# Patient Record
Sex: Male | Born: 1965 | Race: White | Hispanic: No | Marital: Single | Smoking: Never smoker
Health system: Southern US, Community
[De-identification: ages and names within clinical notes are randomized; demographics above are authoritative.]

## PROBLEM LIST (undated history)

## (undated) DIAGNOSIS — E119 Type 2 diabetes mellitus without complications: Secondary | ICD-10-CM

## (undated) DIAGNOSIS — E785 Hyperlipidemia, unspecified: Secondary | ICD-10-CM

## (undated) DIAGNOSIS — I10 Essential (primary) hypertension: Secondary | ICD-10-CM

## (undated) HISTORY — PX: BILATERAL CARPAL TUNNEL RELEASE: SHX6508

## (undated) HISTORY — DX: Essential (primary) hypertension: I10

## (undated) HISTORY — DX: Type 2 diabetes mellitus without complications: E11.9

## (undated) HISTORY — PX: MENISCUS REPAIR: SHX5179

---

## 1998-05-15 ENCOUNTER — Emergency Department (HOSPITAL_COMMUNITY): Admission: EM | Admit: 1998-05-15 | Discharge: 1998-05-15 | Payer: Self-pay | Admitting: Emergency Medicine

## 1998-07-12 ENCOUNTER — Emergency Department (HOSPITAL_COMMUNITY): Admission: EM | Admit: 1998-07-12 | Discharge: 1998-07-12 | Payer: Self-pay | Admitting: Emergency Medicine

## 1998-07-12 ENCOUNTER — Encounter: Payer: Self-pay | Admitting: Emergency Medicine

## 1998-09-29 DIAGNOSIS — I10 Essential (primary) hypertension: Secondary | ICD-10-CM

## 1998-09-29 DIAGNOSIS — E119 Type 2 diabetes mellitus without complications: Secondary | ICD-10-CM

## 1998-09-29 HISTORY — DX: Essential (primary) hypertension: I10

## 1998-09-29 HISTORY — DX: Type 2 diabetes mellitus without complications: E11.9

## 1998-11-08 ENCOUNTER — Emergency Department (HOSPITAL_COMMUNITY): Admission: EM | Admit: 1998-11-08 | Discharge: 1998-11-08 | Payer: Self-pay | Admitting: Emergency Medicine

## 1998-12-10 ENCOUNTER — Emergency Department (HOSPITAL_COMMUNITY): Admission: EM | Admit: 1998-12-10 | Discharge: 1998-12-10 | Payer: Self-pay | Admitting: Internal Medicine

## 1999-04-16 ENCOUNTER — Inpatient Hospital Stay (HOSPITAL_COMMUNITY): Admission: EM | Admit: 1999-04-16 | Discharge: 1999-04-17 | Payer: Self-pay | Admitting: Emergency Medicine

## 1999-04-16 ENCOUNTER — Encounter: Payer: Self-pay | Admitting: Internal Medicine

## 1999-08-01 ENCOUNTER — Encounter: Payer: Self-pay | Admitting: Emergency Medicine

## 1999-08-01 ENCOUNTER — Emergency Department (HOSPITAL_COMMUNITY): Admission: EM | Admit: 1999-08-01 | Discharge: 1999-08-01 | Payer: Self-pay | Admitting: Emergency Medicine

## 1999-08-03 ENCOUNTER — Ambulatory Visit (HOSPITAL_COMMUNITY): Admission: RE | Admit: 1999-08-03 | Discharge: 1999-08-03 | Payer: Self-pay | Admitting: Orthopedic Surgery

## 1999-08-03 ENCOUNTER — Encounter: Payer: Self-pay | Admitting: Orthopedic Surgery

## 1999-08-21 ENCOUNTER — Encounter: Admission: RE | Admit: 1999-08-21 | Discharge: 1999-09-19 | Payer: Self-pay | Admitting: Orthopedic Surgery

## 1999-10-27 ENCOUNTER — Emergency Department (HOSPITAL_COMMUNITY): Admission: EM | Admit: 1999-10-27 | Discharge: 1999-10-27 | Payer: Self-pay | Admitting: Emergency Medicine

## 1999-10-29 ENCOUNTER — Emergency Department (HOSPITAL_COMMUNITY): Admission: EM | Admit: 1999-10-29 | Discharge: 1999-10-29 | Payer: Self-pay

## 2000-02-20 ENCOUNTER — Emergency Department (HOSPITAL_COMMUNITY): Admission: EM | Admit: 2000-02-20 | Discharge: 2000-02-20 | Payer: Self-pay | Admitting: Emergency Medicine

## 2000-09-17 ENCOUNTER — Emergency Department (HOSPITAL_COMMUNITY): Admission: EM | Admit: 2000-09-17 | Discharge: 2000-09-17 | Payer: Self-pay | Admitting: Emergency Medicine

## 2001-08-02 ENCOUNTER — Emergency Department (HOSPITAL_COMMUNITY): Admission: EM | Admit: 2001-08-02 | Discharge: 2001-08-02 | Payer: Self-pay | Admitting: Emergency Medicine

## 2001-08-02 ENCOUNTER — Encounter: Payer: Self-pay | Admitting: Emergency Medicine

## 2001-08-04 ENCOUNTER — Emergency Department (HOSPITAL_COMMUNITY): Admission: EM | Admit: 2001-08-04 | Discharge: 2001-08-04 | Payer: Self-pay | Admitting: Unknown Physician Specialty

## 2001-08-04 ENCOUNTER — Encounter: Payer: Self-pay | Admitting: Emergency Medicine

## 2001-09-08 ENCOUNTER — Emergency Department (HOSPITAL_COMMUNITY): Admission: EM | Admit: 2001-09-08 | Discharge: 2001-09-08 | Payer: Self-pay | Admitting: Emergency Medicine

## 2002-09-29 HISTORY — PX: ACROMIO-CLAVICULAR JOINT REPAIR: SHX5183

## 2005-04-13 ENCOUNTER — Emergency Department (HOSPITAL_COMMUNITY): Admission: EM | Admit: 2005-04-13 | Discharge: 2005-04-13 | Payer: Self-pay | Admitting: *Deleted

## 2007-01-08 ENCOUNTER — Emergency Department (HOSPITAL_COMMUNITY): Admission: EM | Admit: 2007-01-08 | Discharge: 2007-01-08 | Payer: Self-pay | Admitting: Emergency Medicine

## 2007-02-19 ENCOUNTER — Encounter: Admission: RE | Admit: 2007-02-19 | Discharge: 2007-02-19 | Payer: Self-pay | Admitting: Orthopedic Surgery

## 2009-02-04 ENCOUNTER — Emergency Department (HOSPITAL_COMMUNITY): Admission: EM | Admit: 2009-02-04 | Discharge: 2009-02-04 | Payer: Self-pay | Admitting: Emergency Medicine

## 2009-09-27 ENCOUNTER — Inpatient Hospital Stay (HOSPITAL_COMMUNITY): Admission: EM | Admit: 2009-09-27 | Discharge: 2009-09-30 | Payer: Self-pay | Admitting: Emergency Medicine

## 2010-09-25 ENCOUNTER — Emergency Department (HOSPITAL_COMMUNITY)
Admission: EM | Admit: 2010-09-25 | Discharge: 2010-09-25 | Payer: Self-pay | Source: Home / Self Care | Admitting: Emergency Medicine

## 2010-10-02 ENCOUNTER — Encounter
Admission: RE | Admit: 2010-10-02 | Discharge: 2010-10-29 | Payer: Self-pay | Source: Home / Self Care | Attending: Orthopedic Surgery | Admitting: Orthopedic Surgery

## 2010-12-15 LAB — COMPREHENSIVE METABOLIC PANEL
ALT: 46 U/L (ref 0–53)
CO2: 32 mEq/L (ref 19–32)
Calcium: 8.7 mg/dL (ref 8.4–10.5)
Chloride: 103 mEq/L (ref 96–112)
Creatinine, Ser: 1.04 mg/dL (ref 0.4–1.5)
GFR calc non Af Amer: >60 mL/min (ref >60)
Glucose, Bld: 135 mg/dL — ABNORMAL HIGH (ref 70–99)
Total Bilirubin: 0.4 mg/dL (ref 0.3–1.2)

## 2010-12-15 LAB — CBC
HCT: 41.1 % (ref 39.0–52.0)
MCV: 97.9 fL (ref 78.0–100.0)
RBC: 4.19 MIL/uL — ABNORMAL LOW (ref 4.22–5.81)
WBC: 9 10*3/uL (ref 4.0–10.5)

## 2010-12-15 LAB — DIFFERENTIAL
Basophils Absolute: 0.1 10*3/uL (ref 0.0–0.1)
Basophils Relative: 1 % (ref 0–1)
Eosinophils Relative: 4 % (ref 0–5)
Lymphocytes Relative: 22 % (ref 12–46)

## 2010-12-15 LAB — GLUCOSE, CAPILLARY
Glucose-Capillary: 115 mg/dL — ABNORMAL HIGH (ref 70–99)
Glucose-Capillary: 125 mg/dL — ABNORMAL HIGH (ref 70–99)
Glucose-Capillary: 143 mg/dL — ABNORMAL HIGH (ref 70–99)
Glucose-Capillary: 99 mg/dL (ref 70–99)

## 2010-12-15 LAB — MAGNESIUM: Magnesium: 2.1 mg/dL (ref 1.5–2.5)

## 2010-12-30 LAB — GLUCOSE, CAPILLARY
Glucose-Capillary: 111 mg/dL — ABNORMAL HIGH (ref 70–99)
Glucose-Capillary: 122 mg/dL — ABNORMAL HIGH (ref 70–99)
Glucose-Capillary: 124 mg/dL — ABNORMAL HIGH (ref 70–99)
Glucose-Capillary: 136 mg/dL — ABNORMAL HIGH (ref 70–99)

## 2010-12-30 LAB — BASIC METABOLIC PANEL
BUN: 9 mg/dL (ref 6–23)
BUN: 9 mg/dL (ref 6–23)
CO2: 27 mEq/L (ref 19–32)
Chloride: 102 mEq/L (ref 96–112)
Chloride: 105 mEq/L (ref 96–112)
Creatinine, Ser: 1.02 mg/dL (ref 0.4–1.5)
GFR calc non Af Amer: >60 mL/min (ref >60)
Glucose, Bld: 245 mg/dL — ABNORMAL HIGH (ref 70–99)
Potassium: 3.6 mEq/L (ref 3.5–5.1)
Potassium: 4 mEq/L (ref 3.5–5.1)
Sodium: 136 mEq/L (ref 135–145)

## 2010-12-30 LAB — CBC
HCT: 41.8 % (ref 39.0–52.0)
HCT: 47 % (ref 39.0–52.0)
Hemoglobin: 14.6 g/dL (ref 13.0–17.0)
Hemoglobin: 16 g/dL (ref 13.0–17.0)
MCHC: 34.2 g/dL (ref 30.0–36.0)
MCV: 98.3 fL (ref 78.0–100.0)
Platelets: 199 10*3/uL (ref 150–400)
RBC: 4.78 MIL/uL (ref 4.22–5.81)
RDW: 12.8 % (ref 11.5–15.5)
RDW: 12.9 % (ref 11.5–15.5)
WBC: 10.8 10*3/uL — ABNORMAL HIGH (ref 4.0–10.5)

## 2010-12-30 LAB — DIFFERENTIAL
Basophils Absolute: 0 10*3/uL (ref 0.0–0.1)
Basophils Relative: 0 % (ref 0–1)
Eosinophils Relative: 1 % (ref 0–5)
Monocytes Absolute: 0.8 10*3/uL (ref 0.1–1.0)
Monocytes Relative: 7 % (ref 3–12)

## 2010-12-30 LAB — HEMOGLOBIN A1C: Mean Plasma Glucose: 134 mg/dL

## 2010-12-30 LAB — LIPID PANEL: Triglycerides: 54 mg/dL (ref <150)

## 2010-12-30 LAB — CULTURE, ROUTINE-SINUS

## 2011-01-07 LAB — URINE MICROSCOPIC-ADD ON

## 2011-01-07 LAB — COMPREHENSIVE METABOLIC PANEL
AST: 38 U/L — ABNORMAL HIGH (ref 0–37)
Albumin: 3.6 g/dL (ref 3.5–5.2)
BUN: 11 mg/dL (ref 6–23)
Calcium: 8.6 mg/dL (ref 8.4–10.5)
Creatinine, Ser: 0.95 mg/dL (ref 0.4–1.5)
GFR calc Af Amer: >60 mL/min (ref >60)
GFR calc non Af Amer: >60 mL/min (ref >60)

## 2011-01-07 LAB — URINALYSIS, ROUTINE W REFLEX MICROSCOPIC
Glucose, UA: NEGATIVE mg/dL
Hgb urine dipstick: NEGATIVE
Specific Gravity, Urine: 1.03 (ref 1.005–1.030)
pH: 6 (ref 5.0–8.0)

## 2011-01-07 LAB — CBC
HCT: 45.8 % (ref 39.0–52.0)
MCHC: 34.9 g/dL (ref 30.0–36.0)
MCV: 92.9 fL (ref 78.0–100.0)
Platelets: 252 10*3/uL (ref 150–400)

## 2011-01-07 LAB — DIFFERENTIAL
Basophils Absolute: 0 10*3/uL (ref 0.0–0.1)
Eosinophils Relative: 4 % (ref 0–5)
Lymphocytes Relative: 29 % (ref 12–46)
Lymphs Abs: 1.6 10*3/uL (ref 0.7–4.0)
Monocytes Absolute: 0.6 10*3/uL (ref 0.1–1.0)
Neutro Abs: 3.2 10*3/uL (ref 1.7–7.7)

## 2011-01-07 LAB — GLUCOSE, CAPILLARY: Glucose-Capillary: 130 mg/dL — ABNORMAL HIGH (ref 70–99)

## 2011-01-07 LAB — LIPASE, BLOOD: Lipase: 44 U/L (ref 11–59)

## 2015-05-31 ENCOUNTER — Ambulatory Visit: Payer: Self-pay | Admitting: Orthopedic Surgery

## 2015-06-19 ENCOUNTER — Ambulatory Visit: Payer: Self-pay | Admitting: Orthopedic Surgery

## 2015-06-28 ENCOUNTER — Ambulatory Visit (INDEPENDENT_AMBULATORY_CARE_PROVIDER_SITE_OTHER): Payer: Self-pay | Admitting: Orthopedic Surgery

## 2015-06-28 VITALS — BP 167/90 | Ht 67.0 in | Wt 237.4 lb

## 2015-06-28 DIAGNOSIS — M25571 Pain in right ankle and joints of right foot: Secondary | ICD-10-CM

## 2015-06-28 MED ORDER — NAPROXEN 500 MG PO TABS: 500.0000 mg | ORAL_TABLET | Freq: Two times a day (BID) | ORAL | Status: DC

## 2015-06-28 NOTE — Patient Instructions (Signed)
Wear cam walker for 6 weeks when walking Pick up your prescription at pharmacy

## 2015-06-28 NOTE — Progress Notes (Signed)
Patient ID: Cory Mcmahon, male   DOB: Jan 08, 1966, 49 y.o.   MRN: [id number]  New patient   Chief Complaint  Patient presents with  . Ankle Pain    Right ankle pain     Cory Mcmahon is a 49 y.o. male.   HPI 49 year old male presents with medial distal tibial pain and stiffness in the ankle joint with negative x-rays no history of trauma; symptoms present 3 months no trauma. Review of Systems Back pain anxiety and swollen joints and burning pain in his legs  Medical history he reports diabetes depression hypertension arthritis  He's had a left and right carpal tunnel a right anterior cruciate ligament a left meniscus repair 2 and a bone spur removed left shoulder medicines lisinopril and metformin  Social History Social History  Substance Use Topics  . Smoking status: Not on file  . Smokeless tobacco: Not on file  . Alcohol Use: Not on file    No Known Allergies  Current Outpatient Prescriptions  Medication Sig Dispense Refill  . lisinopril (PRINIVIL,ZESTRIL) 10 MG tablet Take 10 mg by mouth daily.    . metFORMIN (GLUCOPHAGE) 500 MG tablet Take by mouth 2 (two) times daily with a meal.    . naproxen (NAPROSYN) 500 MG tablet Take 1 tablet (500 mg total) by mouth 2 (two) times daily with a meal. 60 tablet 2   No current facility-administered medications for this visit.       Physical Exam Blood pressure 167/90, height  (1.702 m), weight 237 lb 6.4 oz (107.684 kg). Physical Exam The patient is well developed well nourished and well groomed. Orientation to person place and time is normal  Mood is pleasant. Ambulatory status is normal without a limp Skin remains intact without laceration ulceration or erythema Gross motor exam is intact without atrophy. Muscle tone normal grade 5 motor strength Neurovascular exam remains intact Inspection left ankle looks normal with full range of motion  Right ankle is tender over the posterior medial tibia near the medial  malleolus but not in the plantar aspect of the foot there is no evidence of pes planus joint range of motion passively is normal and there is no instability    Data Reviewed  I interpreted the images as  Normal  Assessment   Posterior tibial tendinitis perhaps Plan   Recommend Naprosyn and short Cam Walker for 6 weeks and repeat exam if no improvement MRI of the ankle joint

## 2015-07-25 ENCOUNTER — Encounter: Payer: Self-pay | Admitting: Orthopedic Surgery

## 2015-08-08 ENCOUNTER — Encounter: Payer: Self-pay | Admitting: Physician Assistant

## 2015-08-08 ENCOUNTER — Ambulatory Visit: Payer: Self-pay | Admitting: Physician Assistant

## 2015-08-08 VITALS — BP 152/100 | HR 75 | Temp 98.2°F | Ht 66.5 in | Wt 239.0 lb

## 2015-08-08 DIAGNOSIS — Z131 Encounter for screening for diabetes mellitus: Secondary | ICD-10-CM

## 2015-08-08 DIAGNOSIS — Z13220 Encounter for screening for lipoid disorders: Secondary | ICD-10-CM

## 2015-08-08 DIAGNOSIS — I10 Essential (primary) hypertension: Secondary | ICD-10-CM

## 2015-08-08 DIAGNOSIS — E669 Obesity, unspecified: Secondary | ICD-10-CM

## 2015-08-08 DIAGNOSIS — E119 Type 2 diabetes mellitus without complications: Secondary | ICD-10-CM

## 2015-08-08 LAB — GLUCOSE, POCT (MANUAL RESULT ENTRY): POC GLUCOSE: 107 mg/dL — AB (ref 70–99)

## 2015-08-08 MED ORDER — LISINOPRIL 10 MG PO TABS: 10.0000 mg | ORAL_TABLET | Freq: Every day | ORAL | Status: DC

## 2015-08-08 MED ORDER — METFORMIN HCL 500 MG PO TABS: 500.0000 mg | ORAL_TABLET | Freq: Two times a day (BID) | ORAL | Status: DC

## 2015-08-08 NOTE — Progress Notes (Signed)
BP 152/100 mmHg  Pulse 75  Temp(Src) 98.2 F (36.8 C)  Ht 5' 6.5" (1.689 m)  Wt 239 lb (108.41 kg)  BMI 38.00 kg/m2  SpO2 98%   Subjective:    Patient ID: Cory Mcmahon, male    DOB: [birth year], 49 y.o.   MRN: [id number]  HPI: Cory Mcmahon is a 49 y.o. male presenting on 08/08/2015 for New Patient (Initial Visit)   HPI   Chief Complaint  Patient presents with  . New Patient (Initial Visit)     Pt says he ran out of his meds Sunday (2 days ago) Reviewed labs from 04/06/15 - Cr 1.51  Relevant past medical, surgical, family and social history reviewed and updated as indicated. Interim medical history since our last visit reviewed. Allergies and medications reviewed and updated.   Current outpatient prescriptions:  .  naproxen (NAPROSYN) 500 MG tablet, Take 1 tablet (500 mg total) by mouth 2 (two) times daily with a meal. (Patient taking differently: Take 500 mg by mouth as needed. ), Disp: 60 tablet, Rfl: 2 .  lisinopril (PRINIVIL,ZESTRIL) 10 MG tablet, Take 10 mg by mouth daily., Disp: , Rfl:  .  metFORMIN (GLUCOPHAGE) 500 MG tablet, Take by mouth 2 (two) times daily with a meal., Disp: , Rfl:    Review of Systems  Constitutional: Negative for fever, chills, diaphoresis, appetite change, fatigue and unexpected weight change.  HENT: Negative for congestion, dental problem, drooling, ear pain, facial swelling, hearing loss, mouth sores, sneezing, sore throat, trouble swallowing and voice change.   Eyes: Negative for pain, discharge, redness, itching and visual disturbance.  Respiratory: Negative for cough, choking, shortness of breath and wheezing.   Cardiovascular: Negative for chest pain, palpitations and leg swelling.  Gastrointestinal: Negative for vomiting, abdominal pain, diarrhea, constipation and blood in stool.  Endocrine: Negative for cold intolerance, heat intolerance and polydipsia.  Genitourinary: Negative for dysuria, hematuria and decreased urine volume.   Musculoskeletal: Negative for back pain, arthralgias and gait problem.  Skin: Negative for rash.  Allergic/Immunologic: Negative for environmental allergies.  Neurological: Negative for seizures, syncope, light-headedness and headaches.  Hematological: Negative for adenopathy.  Psychiatric/Behavioral: Positive for dysphoric mood. Negative for suicidal ideas and agitation. The patient is nervous/anxious.     Per HPI unless specifically indicated above     Objective:    BP 152/100 mmHg  Pulse 75  Temp(Src) 98.2 F (36.8 C)  Ht 5' 6.5" (1.689 m)  Wt 239 lb (108.41 kg)  BMI 38.00 kg/m2  SpO2 98%  Wt Readings from Last 3 Encounters:  08/08/15 239 lb (108.41 kg)  06/28/15 237 lb 6.4 oz (107.684 kg)    Physical Exam  Constitutional: He is oriented to person, place, and time. He appears well-developed and well-nourished.  HENT:  Head: Normocephalic and atraumatic.  Mouth/Throat: Oropharynx is clear and moist. No oropharyngeal exudate.  Eyes: Conjunctivae and EOM are normal. Pupils are equal, round, and reactive to light.  Neck: Neck supple. No thyromegaly present.  Cardiovascular: Normal rate and regular rhythm.   Pulmonary/Chest: Effort normal and breath sounds normal. He has no wheezes. He has no rales.  Abdominal: Soft. Bowel sounds are normal. He exhibits no mass. There is no tenderness.  Musculoskeletal: He exhibits no edema.  Lymphadenopathy:    He has no cervical adenopathy.  Neurological: He is alert and oriented to person, place, and time.  Skin: Skin is warm and dry. No rash noted.  Psychiatric: He has a normal mood and affect. His behavior  is normal. Thought content normal.  Vitals reviewed.  Dm foot exam done  Results for orders placed or performed in visit on 08/08/15  POCT Glucose (CBG)  Result Value Ref Range   POC Glucose 107 (A) 70 - 99 mg/dl      Assessment & Plan:   Encounter Diagnoses  Name Primary?  . Diabetes mellitus without complication (HCC)  Yes  . Screening for diabetes mellitus   . Screening cholesterol level   . Essential hypertension, benign   . Obesity, unspecified     -get baseline Labs -gave Rx for meds -pt to attend DM educ class -refer pt for DM eye exam -f/u 1 mo to review labs and recheck bp

## 2015-08-09 ENCOUNTER — Ambulatory Visit: Payer: Self-pay | Admitting: Orthopedic Surgery

## 2015-08-09 ENCOUNTER — Encounter: Payer: Self-pay | Admitting: *Deleted

## 2015-08-10 LAB — BASIC METABOLIC PANEL
BUN: 17 mg/dL (ref 7–25)
CHLORIDE: 104 mmol/L (ref 98–110)
CO2: 24 mmol/L (ref 20–31)
Calcium: 9.5 mg/dL (ref 8.6–10.3)
Creat: 0.92 mg/dL (ref 0.70–1.33)
GLUCOSE: 165 mg/dL — AB (ref 65–99)
POTASSIUM: 4.5 mmol/L (ref 3.5–5.3)
SODIUM: 140 mmol/L (ref 135–146)

## 2015-08-10 LAB — LIPID PANEL
Cholesterol: 153 mg/dL (ref 125–200)
HDL: 53 mg/dL (ref >=40)
LDL Cholesterol: 88 mg/dL (ref <130)
Total CHOL/HDL Ratio: 2.9 Ratio (ref <=5.0)
Triglycerides: 58 mg/dL (ref <150)
VLDL: 12 mg/dL (ref <30)

## 2015-08-10 LAB — HEMOGLOBIN A1C
Hgb A1c MFr Bld: 6.3 % — ABNORMAL HIGH (ref <5.7)
Mean Plasma Glucose: 134 mg/dL — ABNORMAL HIGH (ref <117)

## 2015-08-11 LAB — MICROALBUMIN, URINE: MICROALB UR: 10.6 mg/dL

## 2015-08-14 DIAGNOSIS — E669 Obesity, unspecified: Secondary | ICD-10-CM | POA: Insufficient documentation

## 2015-08-14 DIAGNOSIS — E119 Type 2 diabetes mellitus without complications: Secondary | ICD-10-CM | POA: Insufficient documentation

## 2015-08-14 DIAGNOSIS — I10 Essential (primary) hypertension: Secondary | ICD-10-CM | POA: Insufficient documentation

## 2015-09-11 ENCOUNTER — Encounter: Payer: Self-pay | Admitting: Physician Assistant

## 2015-09-11 ENCOUNTER — Ambulatory Visit: Payer: Self-pay | Admitting: Physician Assistant

## 2015-09-11 VITALS — BP 146/94 | HR 78 | Temp 92.7°F | Ht 66.5 in | Wt 249.0 lb

## 2015-09-11 DIAGNOSIS — E119 Type 2 diabetes mellitus without complications: Secondary | ICD-10-CM

## 2015-09-11 DIAGNOSIS — E669 Obesity, unspecified: Secondary | ICD-10-CM

## 2015-09-11 DIAGNOSIS — I10 Essential (primary) hypertension: Secondary | ICD-10-CM

## 2015-09-11 DIAGNOSIS — R079 Chest pain, unspecified: Secondary | ICD-10-CM

## 2015-09-11 NOTE — Progress Notes (Signed)
BP 146/94 mmHg  Pulse 78  Temp(Src) 92.7 F (33.7 C)  Ht 5' 6.5" (1.689 m)  Wt 249 lb (112.946 kg)  BMI 39.59 kg/m2  SpO2 95%   Subjective:    Patient ID: Cory Mcmahon, male    DOB: 08-02-1966, 49 y.o.   MRN: [id number]  HPI: Cory Mcmahon is a 49 y.o. male presenting on 09/11/2015 for Hypertension; Chest Pain; and Shortness of Breath   HPI  Chief Complaint  Patient presents with  . Hypertension    pt states he took his bp med today  . Chest Pain    pt states his chest hurts a little right now. pt states it comes and goes and has been going on for a couple of weeks  . Shortness of Breath    Pt is seen in [place] satellite office today.  Pt started with CP about 2 wk ago.  Pt states cp now. He denies cough.   Pt too diaphoretic for temperature reading.  No hx CAD.  Pt rates pain 3/10.    Pt describes pain as "sharp with pressure behind it:"  Pt also c/o sob  Relevant past medical, surgical, family and social history reviewed and updated as indicated. Interim medical history since our last visit reviewed. Allergies and medications reviewed and updated.  Current outpatient prescriptions:  .  lisinopril (PRINIVIL,ZESTRIL) 10 MG tablet, Take 1 tablet (10 mg total) by mouth daily., Disp: 30 tablet, Rfl: 3 .  metFORMIN (GLUCOPHAGE) 500 MG tablet, Take 1 tablet (500 mg total) by mouth 2 (two) times daily with a meal., Disp: 60 tablet, Rfl: 3  Review of Systems  Constitutional: Negative for fever, chills, diaphoresis, appetite change, fatigue and unexpected weight change.  HENT: Negative for congestion, dental problem, drooling, ear pain, facial swelling, hearing loss, mouth sores, sneezing, sore throat, trouble swallowing and voice change.   Eyes: Negative for pain, discharge, redness, itching and visual disturbance.  Respiratory: Positive for shortness of breath. Negative for cough, choking and wheezing.   Cardiovascular: Positive for chest pain. Negative for palpitations and leg  swelling.  Gastrointestinal: Negative for vomiting, abdominal pain, diarrhea, constipation and blood in stool.  Endocrine: Negative for cold intolerance, heat intolerance and polydipsia.  Genitourinary: Negative for dysuria, hematuria and decreased urine volume.  Musculoskeletal: Positive for arthralgias. Negative for back pain and gait problem.  Skin: Negative for rash.  Allergic/Immunologic: Negative for environmental allergies.  Neurological: Negative for seizures, syncope, light-headedness and headaches.  Hematological: Negative for adenopathy.  Psychiatric/Behavioral: Positive for dysphoric mood. Negative for suicidal ideas and agitation. The patient is not nervous/anxious.     Per HPI unless specifically indicated above     Objective:    BP 146/94 mmHg  Pulse 78  Temp(Src) 92.7 F (33.7 C)  Ht 5' 6.5" (1.689 m)  Wt 249 lb (112.946 kg)  BMI 39.59 kg/m2  SpO2 95%  Wt Readings from Last 3 Encounters:  09/11/15 249 lb (112.946 kg)  08/08/15 239 lb (108.41 kg)  06/28/15 237 lb 6.4 oz (107.684 kg)    Physical Exam  Constitutional: He is oriented to person, place, and time. He appears well-developed and well-nourished.  HENT:  Head: Normocephalic and atraumatic.  Neck: Neck supple.  Cardiovascular: Normal rate and regular rhythm.   Pulmonary/Chest: Effort normal and breath sounds normal. He has no wheezes.  Abdominal: Soft. Bowel sounds are normal. There is no tenderness.  Lymphadenopathy:    He has no cervical adenopathy.  Neurological: He is alert and  oriented to person, place, and time.  Skin: He is diaphoretic.  Psychiatric: He has a normal mood and affect. His behavior is normal.  Vitals reviewed.     Results for orders placed or performed in visit on 08/08/15  Lipid Profile  Result Value Ref Range   Cholesterol 153 125 - 200 mg/dL   Triglycerides 58 <161<150 mg/dL   HDL 53 >=09>=40 mg/dL   Total CHOL/HDL Ratio 2.9 <=5.0 Ratio   VLDL 12 <30 mg/dL   LDL Cholesterol  88 <604<130 mg/dL  Basic Metabolic Panel (BMET)  Result Value Ref Range   Sodium 140 135 - 146 mmol/L   Potassium 4.5 3.5 - 5.3 mmol/L   Chloride 104 98 - 110 mmol/L   CO2 24 20 - 31 mmol/L   Glucose, Bld 165 (H) 65 - 99 mg/dL   BUN 17 7 - 25 mg/dL   Creat 5.400.92 9.810.70 - 1.911.33 mg/dL   Calcium 9.5 8.6 - 47.810.3 mg/dL  Microalbumin, urine  Result Value Ref Range   Microalb, Ur 10.6 Not estab mg/dL  HgB G9FA1c  Result Value Ref Range   Hgb A1c MFr Bld 6.3 (H) <5.7 %   Mean Plasma Glucose 134 (H) <117 mg/dL  POCT Glucose (CBG)  Result Value Ref Range   POC Glucose 107 (A) 70 - 99 mg/dl      Assessment & Plan:   Encounter Diagnoses  Name Primary?  . Chest pain, unspecified chest pain type Yes  . Essential hypertension, benign   . Type 2 diabetes mellitus without complication, unspecified long term insulin use status (HCC)   . Obesity, unspecified     Discussed with pt that he needs to go to ER now for evaluation of CP.   [facility] adam[facility].  He also refuses to go to hospital in an ambulance.  I told him that I disagreed with this decision but he would not be swayed.  He says he will get someone at home (which is right across the street from our [place] office) to take him to [facility] now for evaluation of his chest pain.    No EKG done or medication administered today as we are in satellite office without the ability to do these things.  Pt to call our office tomorrow to reschedule his regular appointment

## 2015-09-18 ENCOUNTER — Emergency Department (HOSPITAL_COMMUNITY): Payer: Self-pay

## 2015-09-18 ENCOUNTER — Encounter (HOSPITAL_COMMUNITY): Payer: Self-pay

## 2015-09-18 ENCOUNTER — Emergency Department (HOSPITAL_COMMUNITY)
Admission: EM | Admit: 2015-09-18 | Discharge: 2015-09-18 | Disposition: A | Discharge status: Home / Self Care | Payer: Self-pay | Attending: Emergency Medicine | Admitting: Emergency Medicine

## 2015-09-18 DIAGNOSIS — Z7982 Long term (current) use of aspirin: Secondary | ICD-10-CM | POA: Insufficient documentation

## 2015-09-18 DIAGNOSIS — Z79899 Other long term (current) drug therapy: Secondary | ICD-10-CM | POA: Insufficient documentation

## 2015-09-18 DIAGNOSIS — R42 Dizziness and giddiness: Secondary | ICD-10-CM | POA: Insufficient documentation

## 2015-09-18 DIAGNOSIS — Z7984 Long term (current) use of oral hypoglycemic drugs: Secondary | ICD-10-CM | POA: Insufficient documentation

## 2015-09-18 DIAGNOSIS — R079 Chest pain, unspecified: Secondary | ICD-10-CM

## 2015-09-18 DIAGNOSIS — I10 Essential (primary) hypertension: Secondary | ICD-10-CM | POA: Insufficient documentation

## 2015-09-18 DIAGNOSIS — E119 Type 2 diabetes mellitus without complications: Secondary | ICD-10-CM | POA: Insufficient documentation

## 2015-09-18 HISTORY — DX: Hyperlipidemia, unspecified: E78.5

## 2015-09-18 LAB — BASIC METABOLIC PANEL
Anion gap: 9 (ref 5–15)
BUN: 23 mg/dL — ABNORMAL HIGH (ref 6–20)
CHLORIDE: 98 mmol/L — AB (ref 101–111)
CO2: 26 mmol/L (ref 22–32)
CREATININE: 1.12 mg/dL (ref 0.61–1.24)
Calcium: 8.9 mg/dL (ref 8.9–10.3)
Glucose, Bld: 175 mg/dL — ABNORMAL HIGH (ref 65–99)
POTASSIUM: 3.7 mmol/L (ref 3.5–5.1)
SODIUM: 133 mmol/L — AB (ref 135–145)

## 2015-09-18 LAB — CBC
HEMATOCRIT: 45.8 % (ref 39.0–52.0)
Hemoglobin: 16.7 g/dL (ref 13.0–17.0)
MCH: 33.1 pg (ref 26.0–34.0)
MCHC: 36.5 g/dL — ABNORMAL HIGH (ref 30.0–36.0)
MCV: 90.9 fL (ref 78.0–100.0)
PLATELETS: 276 10*3/uL (ref 150–400)
RBC: 5.04 MIL/uL (ref 4.22–5.81)
RDW: 12 % (ref 11.5–15.5)
WBC: 9.8 10*3/uL (ref 4.0–10.5)

## 2015-09-18 LAB — TROPONIN I: Troponin I: <0.03 ng/mL (ref <0.031)

## 2015-09-18 NOTE — ED Provider Notes (Signed)
CSN: [id number]     Arrival date & time 09/18/15  1415 History   First MD Initiated Contact with Patient 09/18/15 1520     Chief Complaint  Patient presents with  . Chest Pain     HPI Patient presents the emergency department complaining of fluctuating chest pain over the past week.  Today he was preparing for lunch and just got out of his car when he had mild left-sided chest discomfort without radiation.  He reported feeling slightly lightheaded and dizzy at times well.  He has no prior history of heart disease.  He does have a history of diabetes, hypertension.  No prior tobacco use.  He's never had symptoms are pain like this before.  Denies diaphoresis.  No shortness of breath.  He reports resolution of his symptoms at this time.  Symptoms are mild in severity.  Nothing worsens or improves his symptoms.  No cough.  No unilateral leg swelling.  No history DVT or PE.   Past Medical History  Diagnosis Date  . Hypertension 2000  . Diabetes mellitus without complication (HCC) 2000  . Hyperlipidemia    Past Surgical History  Procedure Laterality Date  . Bilateral carpal tunnel release Bilateral 1983 and 1984  . Acromio-clavicular joint repair Right 2004  . Meniscus repair Left 2000 and 2009   Family History  Problem Relation Age of Onset  . Diabetes Mother   . Hypertension Mother    Social History  Substance Use Topics  . Smoking status: Never Smoker   . Smokeless tobacco: Never Used  . Alcohol Use: Yes     Comment: 6 pack a week    Review of Systems  All other systems reviewed and are negative.     Allergies  Review of patient's allergies indicates no known allergies.  Home Medications   Prior to Admission medications   Medication Sig Start Date End Date Taking? Authorizing Provider  aspirin 81 MG tablet Take 81 mg by mouth daily.   Yes Historical Provider, MD  lisinopril (PRINIVIL,ZESTRIL) 10 MG tablet Take 1 tablet (10 mg total) by mouth daily. 08/08/15  Yes  Jacquelin HawkingShannon McElroy, PA-C  metFORMIN (GLUCOPHAGE) 500 MG tablet Take 1 tablet (500 mg total) by mouth 2 (two) times daily with a meal. 08/08/15  Yes Shannon McElroy, PA-C   BP 160/109 mmHg  Pulse 88  Resp 18  SpO2 98% Physical Exam  Constitutional: He is oriented to person, place, and time. He appears well-developed and well-nourished.  HENT:  Head: Normocephalic and atraumatic.  Eyes: EOM are normal.  Neck: Normal range of motion.  Cardiovascular: Normal rate, regular rhythm, normal heart sounds and intact distal pulses.   Pulmonary/Chest: Effort normal and breath sounds normal. No respiratory distress.  Abdominal: Soft. He exhibits no distension. There is no tenderness.  Musculoskeletal: Normal range of motion.  Neurological: He is alert and oriented to person, place, and time.  Skin: Skin is warm and dry.  Psychiatric: He has a normal mood and affect. Judgment normal.  Nursing note and vitals reviewed.   ED Course  Procedures (including critical care time) Labs Review Labs Reviewed  BASIC METABOLIC PANEL - Abnormal; Notable for the following:    Sodium 133 (*)    Chloride 98 (*)    Glucose, Bld 175 (*)    BUN 23 (*)    All other components within normal limits  CBC - Abnormal; Notable for the following:    MCHC 36.5 (*)    All other  components within normal limits  TROPONIN I  TROPONIN I    Imaging Review Dg Chest 2 View  09/18/2015  CLINICAL DATA:  Left anterior chest pain. Shortness of breath. Former smoker. EXAM: CHEST  2 VIEW COMPARISON:  None. FINDINGS: Normal cardiac silhouette and mediastinal contours given slightly reduced lung volumes. No focal airspace opacities. No pleural effusion or pneumothorax. No evidence of edema. Age-indeterminate though presumably chronic mild (< 25%) compression deformity involving the approximate T11 vertebral body with associated focal kyphosis at this location. Regional soft tissues appear normal. IMPRESSION: 1. No definite acute  cardiopulmonary disease on this hypoventilated examination. 2. Age-indeterminate though presumably chronic mild (under 25%) compression deformity involving the superior endplate of the approximate T11 vertebral body. Correlation point tenderness at this location is recommended. Electronically Signed   By: Simonne Come M.D.   On: 09/18/2015 14:51   I have personally reviewed and evaluated these images and lab results as part of my medical decision-making.   EKG Interpretation #1 Date/Time:  Tuesday September 18 2015 14:24:30 EST Ventricular Rate:  96 PR Interval:  158 QRS Duration: 94 QT Interval:  344 QTC Calculation: 434 R Axis:   32 Text Interpretation:  Normal sinus rhythm Normal ECG No significant change  was found Confirmed by Nakia Koble  MD, Fread Kottke ([id number]) on 09/18/2015 3:42:50 PM      EKG Interpretation #2  Date/Time:  Tuesday September 18 2015 17:35:37 EST Ventricular Rate:  88 PR Interval:  164 QRS Duration: 90 QT Interval:  353 QTC Calculation: 427 R Axis:   57 Text Interpretation:  Sinus rhythm No significant change was found Confirmed by Bradd Merlos  MD, Lainey Nelson ([id number]) on 09/18/2015 7:24:39 PM        MDM   Final diagnoses:  Chest pain, unspecified chest pain type    Troponin 2 and EKG 2 are negative.  The patient will need outpatient cardiac workup.  He's been referred to cardiology.  He understands return to ER for new or worsening symptoms.  Asymptomatic at this time.    Azalia Bilis, MD 09/19/15 2250

## 2015-09-18 NOTE — Discharge Instructions (Signed)
Nonspecific Chest Pain  °Chest pain can be caused by many different conditions. There is always a chance that your pain could be related to something serious, such as a heart attack or a blood clot in your lungs. Chest pain can also be caused by conditions that are not life-threatening. If you have chest pain, it is very important to follow up with your health care provider. °CAUSES  °Chest pain can be caused by: °· Heartburn. °· Pneumonia or bronchitis. °· Anxiety or stress. °· Inflammation around your heart (pericarditis) or lung (pleuritis or pleurisy). °· A blood clot in your lung. °· A collapsed lung (pneumothorax). It can develop suddenly on its own (spontaneous pneumothorax) or from trauma to the chest. °· Shingles infection (varicella-zoster virus). °· Heart attack. °· Damage to the bones, muscles, and cartilage that make up your chest wall. This can include: °¨ Bruised bones due to injury. °¨ Strained muscles or cartilage due to frequent or repeated coughing or overwork. °¨ Fracture to one or more ribs. °¨ Sore cartilage due to inflammation (costochondritis). °RISK FACTORS  °Risk factors for chest pain may include: °· Activities that increase your risk for trauma or injury to your chest. °· Respiratory infections or conditions that cause frequent coughing. °· Medical conditions or overeating that can cause heartburn. °· Heart disease or family history of heart disease. °· Conditions or health behaviors that increase your risk of developing a blood clot. °· Having had chicken pox (varicella zoster). °SIGNS AND SYMPTOMS °Chest pain can feel like: °· Burning or tingling on the surface of your chest or deep in your chest. °· Crushing, pressure, aching, or squeezing pain. °· Dull or sharp pain that is worse when you move, cough, or take a deep breath. °· Pain that is also felt in your back, neck, shoulder, or arm, or pain that spreads to any of these areas. °Your chest pain may come and go, or it may stay  constant. °DIAGNOSIS °Lab tests or other studies may be needed to find the cause of your pain. Your health care provider may have you take a test called an ambulatory ECG (electrocardiogram). An ECG records your heartbeat patterns at the time the test is performed. You may also have other tests, such as: °· Transthoracic echocardiogram (TTE). During echocardiography, sound waves are used to create a picture of all of the heart structures and to look at how blood flows through your heart. °· Transesophageal echocardiogram (TEE). This is a more advanced imaging test that obtains images from inside your body. It allows your health care provider to see your heart in finer detail. °· Cardiac monitoring. This allows your health care provider to monitor your heart rate and rhythm in real time. °· Holter monitor. This is a portable device that records your heartbeat and can help to diagnose abnormal heartbeats. It allows your health care provider to track your heart activity for several days, if needed. °· Stress tests. These can be done through exercise or by taking medicine that makes your heart beat more quickly. °· Blood tests. °· Imaging tests. °TREATMENT  °Your treatment depends on what is causing your chest pain. Treatment may include: °· Medicines. These may include: °¨ Acid blockers for heartburn. °¨ Anti-inflammatory medicine. °¨ Pain medicine for inflammatory conditions. °¨ Antibiotic medicine, if an infection is present. °¨ Medicines to dissolve blood clots. °¨ Medicines to treat coronary artery disease. °· Supportive care for conditions that do not require medicines. This may include: °¨ Resting. °¨ Applying heat   or cold packs to injured areas. °¨ Limiting activities until pain decreases. °HOME CARE INSTRUCTIONS °· If you were prescribed an antibiotic medicine, finish it all even if you start to feel better. °· Avoid any activities that bring on chest pain. °· Do not use any tobacco products, including  cigarettes, chewing tobacco, or electronic cigarettes. If you need help quitting, ask your health care provider. °· Do not drink alcohol. °· Take medicines only as directed by your health care provider. °· Keep all follow-up visits as directed by your health care provider. This is important. This includes any further testing if your chest pain does not go away. °· If heartburn is the cause for your chest pain, you may be told to keep your head raised (elevated) while sleeping. This reduces the chance that acid will go from your stomach into your esophagus. °· Make lifestyle changes as directed by your health care provider. These may include: °¨ Getting regular exercise. Ask your health care provider to suggest some activities that are safe for you. °¨ Eating a heart-healthy diet. A registered dietitian can help you to learn healthy eating options. °¨ Maintaining a healthy weight. °¨ Managing diabetes, if necessary. °¨ Reducing stress. °SEEK MEDICAL CARE IF: °· Your chest pain does not go away after treatment. °· You have a rash with blisters on your chest. °· You have a fever. °SEEK IMMEDIATE MEDICAL CARE IF:  °· Your chest pain is worse. °· You have an increasing cough, or you cough up blood. °· You have severe abdominal pain. °· You have severe weakness. °· You faint. °· You have chills. °· You have sudden, unexplained chest discomfort. °· You have sudden, unexplained discomfort in your arms, back, neck, or jaw. °· You have shortness of breath at any time. °· You suddenly start to sweat, or your skin gets clammy. °· You feel nauseous or you vomit. °· You suddenly feel light-headed or dizzy. °· Your heart begins to beat quickly, or it feels like it is skipping beats. °These symptoms may represent a serious problem that is an emergency. Do not wait to see if the symptoms will go away. Get medical help right away. Call your local emergency services (911 in the [place] Do not drive yourself to the hospital. °  °This  information is not intended to replace advice given to you by your health care provider. Make sure you discuss any questions you have with your health care provider. °  °Document Released: 06/25/2005 Document Revised: 10/06/2014 Document Reviewed: 04/21/2014 °Elsevier Interactive Patient Education ©2016 [employer] ° °

## 2015-09-18 NOTE — ED Notes (Signed)
MD at bedside. 

## 2015-09-18 NOTE — ED Notes (Signed)
PT reports chest pain that began one week ago and subsided, left sided chest pain returned today - has associated lightheadedness, dizziness.

## 2015-09-25 ENCOUNTER — Ambulatory Visit: Payer: Self-pay | Admitting: Physician Assistant

## 2015-09-25 ENCOUNTER — Encounter: Payer: Self-pay | Admitting: Physician Assistant

## 2015-09-25 VITALS — BP 148/100 | HR 84 | Temp 96.4°F | Ht 66.5 in | Wt 249.6 lb

## 2015-09-25 DIAGNOSIS — R079 Chest pain, unspecified: Secondary | ICD-10-CM

## 2015-09-25 DIAGNOSIS — E119 Type 2 diabetes mellitus without complications: Secondary | ICD-10-CM

## 2015-09-25 DIAGNOSIS — E669 Obesity, unspecified: Secondary | ICD-10-CM

## 2015-09-25 DIAGNOSIS — I10 Essential (primary) hypertension: Secondary | ICD-10-CM

## 2015-09-25 MED ORDER — NITROGLYCERIN 0.4 MG SL SUBL: 0.4000 mg | SUBLINGUAL_TABLET | SUBLINGUAL | Status: AC | PRN

## 2015-09-25 MED ORDER — METFORMIN HCL 500 MG PO TABS: 500.0000 mg | ORAL_TABLET | Freq: Two times a day (BID) | ORAL | Status: DC

## 2015-09-25 MED ORDER — LISINOPRIL 10 MG PO TABS: 10.0000 mg | ORAL_TABLET | Freq: Every day | ORAL | Status: AC

## 2015-09-25 MED ORDER — METOPROLOL TARTRATE 50 MG PO TABS: 50.0000 mg | ORAL_TABLET | Freq: Two times a day (BID) | ORAL | Status: AC

## 2015-09-25 NOTE — Progress Notes (Signed)
BP 148/100 mmHg  Pulse 84  Temp(Src) 96.4 F (35.8 C)  Ht 5' 6.5" (1.689 m)  Wt 249 lb 9.6 oz (113.218 kg)  BMI 39.69 kg/m2  SpO2 96%   Subjective:    Patient ID: Cory Mcmahon, male    DOB: 1966-01-31, 49 y.o.   MRN: [id number]  HPI: Cory Mcmahon is a 49 y.o. male presenting on 09/25/2015 for Follow-up and Chest Pain   HPI   Pt says he never went to ER on 09/11/15 as instructed.  He did go on 09/18/15.    Chief Complaint  Patient presents with  . Follow-up    from ER  . Chest Pain    feels little chest pain right now   Pt says he is sweaty a lot, mostly when he is having the chest pain with radiation into the L shoulder and arm.  Pt has been taking his girlfriends  lisinopril  Relevant past medical, surgical, family and social history reviewed and updated as indicated. Interim medical history since our last visit reviewed. Allergies and medications reviewed and updated.  Current outpatient prescriptions:  .  aspirin 81 MG tablet, Take 81 mg by mouth daily., Disp: , Rfl:  .  lisinopril (PRINIVIL,ZESTRIL) 10 MG tablet, Take 1 tablet (10 mg total) by mouth daily. (Patient taking differently: Take 20 mg by mouth daily. ), Disp: 30 tablet, Rfl: 3 .  metFORMIN (GLUCOPHAGE) 500 MG tablet, Take 1 tablet (500 mg total) by mouth 2 (two) times daily with a meal. (Patient not taking: Reported on 09/25/2015), Disp: 60 tablet, Rfl: 3   Review of Systems  Constitutional: Positive for chills, diaphoresis and fatigue. Negative for fever, appetite change and unexpected weight change.  HENT: Negative for congestion, dental problem, drooling, ear pain, facial swelling, hearing loss, mouth sores, sneezing, sore throat, trouble swallowing and voice change.   Eyes: Negative for pain, discharge, redness, itching and visual disturbance.  Respiratory: Positive for shortness of breath and wheezing. Negative for cough and choking.   Cardiovascular: Positive for chest pain. Negative for  palpitations and leg swelling.  Gastrointestinal: Negative for vomiting, abdominal pain, diarrhea, constipation and blood in stool.  Endocrine: Negative for cold intolerance, heat intolerance and polydipsia.  Genitourinary: Negative for dysuria, hematuria and decreased urine volume.  Musculoskeletal: Positive for arthralgias. Negative for back pain and gait problem.  Skin: Negative for rash.  Allergic/Immunologic: Negative for environmental allergies.  Neurological: Positive for light-headedness. Negative for seizures, syncope and headaches.  Hematological: Negative for adenopathy.  Psychiatric/Behavioral: Positive for dysphoric mood. Negative for suicidal ideas and agitation. The patient is nervous/anxious.     Per HPI unless specifically indicated above     Objective:    BP 148/100 mmHg  Pulse 84  Temp(Src) 96.4 F (35.8 C)  Ht 5' 6.5" (1.689 m)  Wt 249 lb 9.6 oz (113.218 kg)  BMI 39.69 kg/m2  SpO2 96%  Wt Readings from Last 3 Encounters:  09/25/15 249 lb 9.6 oz (113.218 kg)  09/11/15 249 lb (112.946 kg)  08/08/15 239 lb (108.41 kg)    Physical Exam  Constitutional: He is oriented to person, place, and time. He appears well-developed and well-nourished.  HENT:  Head: Normocephalic and atraumatic.  Neck: Neck supple.  Cardiovascular: Normal rate and regular rhythm.   Pulmonary/Chest: Effort normal and breath sounds normal. He has no wheezes.  Abdominal: Soft. Bowel sounds are normal. There is no tenderness.  Musculoskeletal: He exhibits no edema.  Lymphadenopathy:    He has no  cervical adenopathy.  Neurological: He is alert and oriented to person, place, and time.  Skin: Skin is warm and dry.  Psychiatric: He has a normal mood and affect. His behavior is normal.  Vitals reviewed.       Assessment & Plan:   Encounter Diagnoses  Name Primary?  . Chest pain, unspecified chest pain type Yes  . Essential hypertension, benign   . Diabetes mellitus without  complication (HCC)   . Obesity, unspecified     -reviewed labs with pt -rx metoprolol and ntg.  Pt told to take his own lisinopril (10mg ) continue aspirin low-dose daily -pt counseled on how to properly take his NTG including the need to call EMS if pain persists after 3rd dose -restart his metformin -Refer to cardiology for cardiac evaluation of CP -Pt is given [facility] discount application -f/u OV 1 mo.

## 2015-10-23 ENCOUNTER — Ambulatory Visit (INDEPENDENT_AMBULATORY_CARE_PROVIDER_SITE_OTHER): Payer: Self-pay | Admitting: Cardiology

## 2015-10-23 ENCOUNTER — Other Ambulatory Visit: Payer: Self-pay | Admitting: Cardiology

## 2015-10-23 ENCOUNTER — Encounter: Payer: Self-pay | Admitting: Cardiology

## 2015-10-23 VITALS — BP 136/90 | HR 90 | Ht 67.0 in | Wt 246.0 lb

## 2015-10-23 DIAGNOSIS — I10 Essential (primary) hypertension: Secondary | ICD-10-CM

## 2015-10-23 DIAGNOSIS — I200 Unstable angina: Secondary | ICD-10-CM

## 2015-10-23 DIAGNOSIS — E785 Hyperlipidemia, unspecified: Secondary | ICD-10-CM

## 2015-10-23 DIAGNOSIS — E1159 Type 2 diabetes mellitus with other circulatory complications: Secondary | ICD-10-CM

## 2015-10-23 NOTE — Patient Instructions (Signed)
Your physician has requested that you have a cardiac catheterization. Cardiac catheterization is used to diagnose and/or treat various heart conditions. Doctors may recommend this procedure for a number of different reasons. The most common reason is to evaluate chest pain. Chest pain can be a symptom of coronary artery disease (CAD), and cardiac catheterization can show whether plaque is narrowing or blocking your heart's arteries. This procedure is also used to evaluate the valves, as well as measure the blood flow and oxygen levels in different parts of your heart. For further information please visit [email or web address] Please follow instruction sheet, as given.     Follow instructions on cath sheet         Thank you for choosing [facility] !

## 2015-10-23 NOTE — Progress Notes (Signed)
  Cardiology Office Note  Date: 10/23/2015   ID: Darian Mcmahon, DOB 06/16/1966, MRN [id number]  PCP: Shannon McElroy, PA-C  Consulting Cardiologist: Nayab Aten, MD   Chief Complaint  Patient presents with  . Chest Pain    History of Present Illness: Cory Mcmahon is a 49 y.o. male referred by Shannon McElroy PA-C for cardiology consultation. He presents with a two-month history of recurring chest discomfort described at times as a "tightness" and other times as a "sharp pain." This has occurred with activities and also sitting still, intermittently associated with diaphoresis and also radiation into the left arm. Symptoms are moderate in intensity and generally last for several minutes at a time. He also feels associated dyspnea on exertion and a general decrease in stamina.  He is described himself as being active, although is currently not working. He previously worked in construction and also with steel, was traveling around a lot on different jobs, not eating well during these times. 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  He has a long-standing history of hypertension and type 2 diabetes mellitus, 17 years in total. He does not know his father's medical history, but states that his mother died in her mid 50s with heart failure and long-standing type 1 diabetes mellitus. He underwent a treadmill test many years ago in [place], does not recall any abnormalities at that time. Recent ECG done in December 2016 showed normal sinus rhythm.  We reviewed his medications which are outlined below. He reports compliance, Lopressor being the most recent addition. This has not alleviated his symptoms. He has not yet tried nitroglycerin.  Past Medical History  Diagnosis Date  . Essential hypertension 2000  . Type 2 diabetes mellitus (HCC) 2000  . Hyperlipidemia     Past Surgical History  Procedure Laterality Date  . Bilateral carpal  tunnel release Bilateral 1983 and 1984  . Acromio-clavicular joint repair Right 2004  . Meniscus repair Left 2000 and 2009    Current Outpatient Prescriptions  Medication Sig Dispense Refill  . aspirin 81 MG tablet Take 81 mg by mouth daily.    . lisinopril (PRINIVIL,ZESTRIL) 10 MG tablet Take 1 tablet (10 mg total) by mouth daily. 30 tablet 3  . metFORMIN (GLUCOPHAGE) 500 MG tablet Take 1 tablet (500 mg total) by mouth 2 (two) times daily with a meal. 60 tablet 3  . metoprolol (LOPRESSOR) 50 MG tablet Take 1 tablet (50 mg total) by mouth 2 (two) times daily. 60 tablet 3  . nitroGLYCERIN (NITROSTAT) 0.4 MG SL tablet Place 1 tablet (0.4 mg total) under the tongue every 5 (five) minutes as needed for chest pain. 25 tablet 3   No current facility-administered medications for this visit.   Allergies:  Review of patient's allergies indicates no known allergies.   Social History: The patient  reports that he has never smoked. He has never used smokeless tobacco. He reports that he drinks alcohol. He reports that he does not use illicit drugs.   Family History: The patient's family history includes Diabetes Mellitus I in his mother; Hypertension in his mother.   ROS:  Please see the history of present illness. Otherwise, complete review of systems is positive for mild pain in his right knee at times with activities. He has previous history of surgery on his right knee and other orthopedic injuries..  All other systems are reviewed and negative.   Physical Exam: VS:  BP   136/90 mmHg  Pulse 90  Ht 5' 7" (1.702 m)  Wt 246 lb (111.585 kg)  BMI 38.52 kg/m2  SpO2 95%, BMI Body mass index is 38.52 kg/(m^2).  Wt Readings from Last 3 Encounters:  10/23/15 246 lb (111.585 kg)  09/25/15 249 lb 9.6 oz (113.218 kg)  09/11/15 249 lb (112.946 kg)    General: Overweight male, appears comfortable at rest. HEENT: Conjunctiva and lids normal, oropharynx clear. Neck: Supple, no elevated JVP or carotid  bruits, no thyromegaly. Lungs: Clear to auscultation, nonlabored breathing at rest. Cardiac: Regular rate and rhythm, no S3 or significant systolic murmur, no pericardial rub. Abdomen: Soft, nontender, bowel sounds present, no guarding or rebound. Extremities: No pitting edema, distal pulses 2+. Skin: Warm and dry. Multiple tattoos. Musculoskeletal: No kyphosis. Neuropsychiatric: Alert and oriented x3, affect grossly appropriate.  ECG: I reviewed his tracing from 09/18/2015 which showed normal sinus rhythm.  Recent Labwork: 09/18/2015: BUN 23*; Creatinine, Ser 1.12; Hemoglobin 16.7; Platelets 276; Potassium 3.7; Sodium 133*     Component Value Date/Time   CHOL 153 08/08/2015 1016   TRIG 58 08/08/2015 1016   HDL 53 08/08/2015 1016   CHOLHDL 2.9 08/08/2015 1016   VLDL 12 08/08/2015 1016   LDLCALC 88 08/08/2015 1016    Other Studies Reviewed Today:  Chest x-ray 09/18/2015: FINDINGS: Normal cardiac silhouette and mediastinal contours given slightly reduced lung volumes. No focal airspace opacities. No pleural effusion or pneumothorax. No evidence of edema. Age-indeterminate though presumably chronic mild (< 25%) compression deformity involving the approximate T11 vertebral body with associated focal kyphosis at this location. Regional soft tissues appear normal.  IMPRESSION: 1. No definite acute cardiopulmonary disease on this hypoventilated examination. 2. Age-indeterminate though presumably chronic mild (under 25%) compression deformity involving the superior endplate of the approximate T11 vertebral body. Correlation point tenderness at this location is recommended.  Assessment and Plan:  1. Symptoms concerning for accelerating angina in a 49-year-old male with long-standing type 2 diabetes mellitus and hypertension, as well as family history including type 1 diabetes mellitus in his mother who died of heart failure in her mid 50s. Recent resting ECG reviewed and normal.  Chest x-ray was without acute cardiopulmonary findings. He has not undergone any ischemic testing in many years following a reported treadmill [place]. We discussed both noninvasive and invasive techniques for further cardiac ischemic evaluation, including the potential risks and anticipated benefits. After discussion, our plan is to schedule a diagnostic cardiac catheterization to clearly outline his coronary anatomy and assess for any revascularization options. He is in agreement to proceed.  2. Long-standing type 2 diabetes mellitus, reportedly since 2000. He is currently on Glucophage. Hemoglobin A1c was 6.3% in November 2016. [facility]. Work on weight loss.  3. Essential hypertension, he reports compliance with lisinopril as well as the recent addition of Lopressor.  4. Reported history of hyperlipidemia, currently not on specific medical therapy. Recent LDL was 88. At least low-dose statin therapy would be indicated with concurrent diagnosis of type 2 diabetes mellitus, particularly if ischemic heart disease is documented as well.  Current medicines were reviewed with the patient today.  Disposition: FU with me after cardiac catheterization.   Signed, Miyeko Mahlum G. Lorann Tani, MD, FACC 10/23/2015 2:22 PM    [facility] at [place] [address], [place], [place] [address] Phone: [phone]; Fax: [phone]  

## 2015-10-24 ENCOUNTER — Ambulatory Visit: Payer: Self-pay | Admitting: Physician Assistant

## 2015-10-26 ENCOUNTER — Ambulatory Visit (HOSPITAL_COMMUNITY)
Admission: RE | Admit: 2015-10-26 | Discharge: 2015-10-26 | Disposition: A | Discharge status: Home / Self Care | Payer: Self-pay | Source: Ambulatory Visit | Attending: Cardiology | Admitting: Cardiology

## 2015-10-26 ENCOUNTER — Encounter (HOSPITAL_COMMUNITY): Payer: Self-pay | Admitting: Cardiology

## 2015-10-26 ENCOUNTER — Encounter (HOSPITAL_COMMUNITY)
Admission: RE | Disposition: A | Discharge status: Home / Self Care | Payer: Self-pay | Source: Ambulatory Visit | Attending: Cardiology

## 2015-10-26 DIAGNOSIS — Z6838 Body mass index (BMI) 38.0-38.9, adult: Secondary | ICD-10-CM | POA: Insufficient documentation

## 2015-10-26 DIAGNOSIS — I10 Essential (primary) hypertension: Secondary | ICD-10-CM | POA: Diagnosis present

## 2015-10-26 DIAGNOSIS — Z8249 Family history of ischemic heart disease and other diseases of the circulatory system: Secondary | ICD-10-CM | POA: Insufficient documentation

## 2015-10-26 DIAGNOSIS — E669 Obesity, unspecified: Secondary | ICD-10-CM | POA: Diagnosis present

## 2015-10-26 DIAGNOSIS — E119 Type 2 diabetes mellitus without complications: Secondary | ICD-10-CM

## 2015-10-26 DIAGNOSIS — R079 Chest pain, unspecified: Secondary | ICD-10-CM | POA: Diagnosis present

## 2015-10-26 DIAGNOSIS — E785 Hyperlipidemia, unspecified: Secondary | ICD-10-CM | POA: Insufficient documentation

## 2015-10-26 DIAGNOSIS — Z7984 Long term (current) use of oral hypoglycemic drugs: Secondary | ICD-10-CM | POA: Insufficient documentation

## 2015-10-26 DIAGNOSIS — I200 Unstable angina: Secondary | ICD-10-CM

## 2015-10-26 HISTORY — PX: CARDIAC CATHETERIZATION: SHX172

## 2015-10-26 LAB — BASIC METABOLIC PANEL
Anion gap: 8 (ref 5–15)
BUN: 14 mg/dL (ref 6–20)
CALCIUM: 9.2 mg/dL (ref 8.9–10.3)
CO2: 28 mmol/L (ref 22–32)
CREATININE: 0.99 mg/dL (ref 0.61–1.24)
Chloride: 103 mmol/L (ref 101–111)
GFR calc Af Amer: >60 mL/min (ref >60)
Glucose, Bld: 142 mg/dL — ABNORMAL HIGH (ref 65–99)
Potassium: 4.2 mmol/L (ref 3.5–5.1)
SODIUM: 139 mmol/L (ref 135–145)

## 2015-10-26 LAB — CBC
HCT: 41.5 % (ref 39.0–52.0)
Hemoglobin: 14.8 g/dL (ref 13.0–17.0)
MCH: 32.5 pg (ref 26.0–34.0)
MCHC: 35.7 g/dL (ref 30.0–36.0)
MCV: 91 fL (ref 78.0–100.0)
PLATELETS: 242 10*3/uL (ref 150–400)
RBC: 4.56 MIL/uL (ref 4.22–5.81)
RDW: 12.7 % (ref 11.5–15.5)
WBC: 6.5 10*3/uL (ref 4.0–10.5)

## 2015-10-26 LAB — GLUCOSE, CAPILLARY: GLUCOSE-CAPILLARY: 137 mg/dL — AB (ref 65–99)

## 2015-10-26 LAB — PROTIME-INR
INR: 1.4 (ref 0.00–1.49)
Prothrombin Time: 17.3 seconds — ABNORMAL HIGH (ref 11.6–15.2)

## 2015-10-26 SURGERY — LEFT HEART CATH AND CORONARY ANGIOGRAPHY
Anesthesia: LOCAL

## 2015-10-26 MED ORDER — SODIUM CHLORIDE 0.9 % IV SOLN
INTRAVENOUS | Status: DC
  Administered 2015-10-26: 07:00:00 via INTRAVENOUS

## 2015-10-26 MED ORDER — MIDAZOLAM HCL 2 MG/2ML IJ SOLN
INTRAMUSCULAR | Status: DC | PRN
  Administered 2015-10-26: 1 mg via INTRAVENOUS
  Administered 2015-10-26: 2 mg via INTRAVENOUS

## 2015-10-26 MED ORDER — SODIUM CHLORIDE 0.9% FLUSH: 3.0000 mL | INTRAVENOUS | Status: DC | PRN

## 2015-10-26 MED ORDER — FENTANYL CITRATE (PF) 100 MCG/2ML IJ SOLN
INTRAMUSCULAR | Status: DC | PRN
  Administered 2015-10-26 (×2): 25 ug via INTRAVENOUS

## 2015-10-26 MED ORDER — VERAPAMIL HCL 2.5 MG/ML IV SOLN
INTRAVENOUS | Status: AC
  Filled 2015-10-26: qty 2

## 2015-10-26 MED ORDER — ASPIRIN 81 MG PO CHEW
81.0000 mg | CHEWABLE_TABLET | ORAL | Status: AC
  Administered 2015-10-26: 81 mg via ORAL

## 2015-10-26 MED ORDER — MIDAZOLAM HCL 2 MG/2ML IJ SOLN
INTRAMUSCULAR | Status: AC
  Filled 2015-10-26: qty 2

## 2015-10-26 MED ORDER — LIDOCAINE HCL (PF) 1 % IJ SOLN
INTRAMUSCULAR | Status: AC
  Filled 2015-10-26: qty 30

## 2015-10-26 MED ORDER — HEPARIN SODIUM (PORCINE) 1000 UNIT/ML IJ SOLN
INTRAMUSCULAR | Status: AC
  Filled 2015-10-26: qty 1

## 2015-10-26 MED ORDER — SODIUM CHLORIDE 0.9% FLUSH: 3.0000 mL | Freq: Two times a day (BID) | INTRAVENOUS | Status: DC

## 2015-10-26 MED ORDER — HEPARIN (PORCINE) IN NACL 2-0.9 UNIT/ML-% IJ SOLN
INTRAMUSCULAR | Status: AC
  Filled 2015-10-26: qty 1500

## 2015-10-26 MED ORDER — IOHEXOL 350 MG/ML SOLN
INTRAVENOUS | Status: DC | PRN
  Administered 2015-10-26: 100 mL via INTRAVENOUS

## 2015-10-26 MED ORDER — LIDOCAINE HCL (PF) 1 % IJ SOLN
INTRAMUSCULAR | Status: DC | PRN
  Administered 2015-10-26 (×2): 2 mL

## 2015-10-26 MED ORDER — HEPARIN SODIUM (PORCINE) 1000 UNIT/ML IJ SOLN
INTRAMUSCULAR | Status: DC | PRN
  Administered 2015-10-26: 6000 [IU] via INTRAVENOUS

## 2015-10-26 MED ORDER — SODIUM CHLORIDE 0.9 % IV SOLN: 250.0000 mL | INTRAVENOUS | Status: DC | PRN

## 2015-10-26 MED ORDER — FENTANYL CITRATE (PF) 100 MCG/2ML IJ SOLN
INTRAMUSCULAR | Status: AC
  Filled 2015-10-26: qty 2

## 2015-10-26 MED ORDER — ASPIRIN 81 MG PO CHEW
CHEWABLE_TABLET | ORAL | Status: AC
  Filled 2015-10-26: qty 1

## 2015-10-26 MED ORDER — HEPARIN (PORCINE) IN NACL 2-0.9 UNIT/ML-% IJ SOLN
INTRAMUSCULAR | Status: DC | PRN
  Administered 2015-10-26 (×2): 10 mL via INTRA_ARTERIAL

## 2015-10-26 MED ORDER — SODIUM CHLORIDE 0.9 % WEIGHT BASED INFUSION: 1.0000 mL/kg/h | INTRAVENOUS | Status: AC

## 2015-10-26 MED ORDER — NITROGLYCERIN 1 MG/10 ML FOR IR/CATH LAB
INTRA_ARTERIAL | Status: DC | PRN
  Administered 2015-10-26: 08:00:00

## 2015-10-26 SURGICAL SUPPLY — 12 items
CATH INFINITI 5 FR JL3.5 (CATHETERS) ×2 IMPLANT
CATH INFINITI 5FR ANG PIGTAIL (CATHETERS) ×2 IMPLANT
CATH INFINITI JR4 5F (CATHETERS) ×2 IMPLANT
DEVICE RAD COMP TR BAND LRG (VASCULAR PRODUCTS) ×4 IMPLANT
GLIDESHEATH SLEND SS 6F .021 (SHEATH) ×4 IMPLANT
KIT HEART LEFT (KITS) ×2 IMPLANT
PACK CARDIAC CATHETERIZATION (CUSTOM PROCEDURE TRAY) ×2 IMPLANT
SYR MEDRAD MARK V 150ML (SYRINGE) ×2 IMPLANT
TRANSDUCER W/STOPCOCK (MISCELLANEOUS) ×2 IMPLANT
TUBING CIL FLEX 10 FLL-RA (TUBING) ×2 IMPLANT
WIRE HI TORQ VERSACORE-J 145CM (WIRE) ×2 IMPLANT
WIRE SAFE-T 1.5MM-J .035X260CM (WIRE) ×2 IMPLANT

## 2015-10-26 NOTE — Research (Signed)
CADLAD Informed Consent   Subject Name: Cory Mcmahon  Subject met inclusion and exclusion criteria.  The informed consent form, study requirements and expectations were reviewed with the subject and questions and concerns were addressed prior to the signing of the consent form.  The subject verbalized understanding of the trail requirements.  The subject agreed to participate in the CADLAD trial and signed the informed consent.  The informed consent was obtained prior to performance of any protocol-specific procedures for the subject.  A copy of the signed informed consent was given to the subject and a copy was placed in the subject's medical record.  [name] 10/26/2015, 0700 am

## 2015-10-26 NOTE — H&P (View-Only) (Signed)
Cardiology Office Note  Date: 10/23/2015   ID: Cory Mcmahon, DOB 06-13-1966, MRN [id number]  PCP: Jacquelin Hawking, PA-C  Consulting Cardiologist: Nona Dell, MD   Chief Complaint  Patient presents with  . Chest Pain    History of Present Illness: Cory Mcmahon is a 50 y.o. male referred by Jacquelin Hawking PA-C for cardiology consultation. He presents with a two-month history of recurring chest discomfort described at times as a "tightness" and other times as a "sharp pain." This has occurred with activities and also sitting still, intermittently associated with diaphoresis and also radiation into the left arm. Symptoms are moderate in intensity and generally last for several minutes at a time. He also feels associated dyspnea on exertion and a general decrease in stamina.  He is described himself as being active, although is currently not working. 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  He has a long-standing history of hypertension and type 2 diabetes mellitus, 17 years in total. He does not know his father's medical history, but states that his mother died in her mid 65s with heart failure and long-standing type 1 diabetes mellitus. He underwent a treadmill test many years ago in [place], does not recall any abnormalities at that time. Recent ECG done in December 2016 showed normal sinus rhythm.  We reviewed his medications which are outlined below. He reports compliance, Lopressor being the most recent addition. This has not alleviated his symptoms. He has not yet tried nitroglycerin.  Past Medical History  Diagnosis Date  . Essential hypertension 2000  . Type 2 diabetes mellitus (HCC) 2000  . Hyperlipidemia     Past Surgical History  Procedure Laterality Date  . Bilateral carpal  tunnel release Bilateral 1983 and 1984  . Acromio-clavicular joint repair Right 2004  . Meniscus repair Left 2000 and 2009    Current Outpatient Prescriptions  Medication Sig Dispense Refill  . aspirin 81 MG tablet Take 81 mg by mouth daily.    Marland Kitchen lisinopril (PRINIVIL,ZESTRIL) 10 MG tablet Take 1 tablet (10 mg total) by mouth daily. 30 tablet 3  . metFORMIN (GLUCOPHAGE) 500 MG tablet Take 1 tablet (500 mg total) by mouth 2 (two) times daily with a meal. 60 tablet 3  . metoprolol (LOPRESSOR) 50 MG tablet Take 1 tablet (50 mg total) by mouth 2 (two) times daily. 60 tablet 3  . nitroGLYCERIN (NITROSTAT) 0.4 MG SL tablet Place 1 tablet (0.4 mg total) under the tongue every 5 (five) minutes as needed for chest pain. 25 tablet 3   No current facility-administered medications for this visit.   Allergies:  Review of patient's allergies indicates no known allergies.   Social History: The patient  reports that he has never smoked. He has never used smokeless tobacco. He reports that he drinks alcohol. He reports that he does not use illicit drugs.   Family History: The patient's family history includes Diabetes Mellitus I in his mother; Hypertension in his mother.   ROS:  Please see the history of present illness. Otherwise, complete review of systems is positive for mild pain in his right knee at times with activities. He has previous history of surgery on his right knee and other orthopedic injuries..  All other systems are reviewed and negative.   Physical Exam: VS:  BP  136/90 mmHg  Pulse 90  Ht  (1.702 m)  Wt 246 lb (111.585 kg)  BMI 38.52 kg/m2  SpO2 95%, BMI Body mass index is 38.52 kg/(m^2).  Wt Readings from Last 3 Encounters:  10/23/15 246 lb (111.585 kg)  09/25/15 249 lb 9.6 oz (113.218 kg)  09/11/15 249 lb (112.946 kg)    General: Overweight male, appears comfortable at rest. HEENT: Conjunctiva and lids normal, oropharynx clear. Neck: Supple, no elevated JVP or carotid  bruits, no thyromegaly. Lungs: Clear to auscultation, nonlabored breathing at rest. Cardiac: Regular rate and rhythm, no S3 or significant systolic murmur, no pericardial rub. Abdomen: Soft, nontender, bowel sounds present, no guarding or rebound. Extremities: No pitting edema, distal pulses 2+. Skin: Warm and dry. Multiple tattoos. Musculoskeletal: No kyphosis. Neuropsychiatric: Alert and oriented x3, affect grossly appropriate.  ECG: I reviewed his tracing from 09/18/2015 which showed normal sinus rhythm.  Recent Labwork: 09/18/2015: BUN 23*; Creatinine, Ser 1.12; Hemoglobin 16.7; Platelets 276; Potassium 3.7; Sodium 133*     Component Value Date/Time   CHOL 153 08/08/2015 1016   TRIG 58 08/08/2015 1016   HDL 53 08/08/2015 1016   CHOLHDL 2.9 08/08/2015 1016   VLDL 12 08/08/2015 1016   LDLCALC 88 08/08/2015 1016    Other Studies Reviewed Today:  Chest x-ray 09/18/2015: FINDINGS: Normal cardiac silhouette and mediastinal contours given slightly reduced lung volumes. No focal airspace opacities. No pleural effusion or pneumothorax. No evidence of edema. Age-indeterminate though presumably chronic mild (< 25%) compression deformity involving the approximate T11 vertebral body with associated focal kyphosis at this location. Regional soft tissues appear normal.  IMPRESSION: 1. No definite acute cardiopulmonary disease on this hypoventilated examination. 2. Age-indeterminate though presumably chronic mild (under 25%) compression deformity involving the superior endplate of the approximate T11 vertebral body. Correlation point tenderness at this location is recommended.  Assessment and Plan:  1. Symptoms concerning for accelerating angina in a 50 year old male with long-standing type 2 diabetes mellitus and hypertension, as well as family history including type 1 diabetes mellitus in his mother who died of heart failure in her mid 13s. Recent resting ECG reviewed and normal.  Chest x-ray was without acute cardiopulmonary findings. He has not undergone any ischemic testing in many years following a reported treadmill [place]. We discussed both noninvasive and invasive techniques for further cardiac ischemic evaluation, including the potential risks and anticipated benefits. After discussion, our plan is to schedule a diagnostic cardiac catheterization to clearly outline his coronary anatomy and assess for any revascularization options. He is in agreement to proceed.  2. Long-standing type 2 diabetes mellitus, reportedly since 2000. He is currently on Glucophage. Hemoglobin A1c was 6.3% in November 2016. [facility]. Work on weight loss.  3. Essential hypertension, he reports compliance with lisinopril as well as the recent addition of Lopressor.  4. Reported history of hyperlipidemia, currently not on specific medical therapy. Recent LDL was 88. At least low-dose statin therapy would be indicated with concurrent diagnosis of type 2 diabetes mellitus, particularly if ischemic heart disease is documented as well.  Current medicines were reviewed with the patient today.  Disposition: FU with me after cardiac catheterization.   Signed, Jonelle Sidle, MD, [facility] 10/23/2015 2:22 PM    [facility] at [facility] [address] [place], [place] [address] Phone: [phone]; Fax: [phone]

## 2015-10-26 NOTE — Discharge Instructions (Signed)
Radial Site Care °Refer to this sheet in the next few weeks. These instructions provide you with information about caring for yourself after your procedure. Your health care provider may also give you more specific instructions. Your treatment has been planned according to current medical practices, but problems sometimes occur. Call your health care provider if you have any problems or questions after your procedure. °WHAT TO EXPECT AFTER THE PROCEDURE °After your procedure, it is typical to have the following: °· Bruising at the radial site that usually fades within 1-2 weeks. °· Blood collecting in the tissue (hematoma) that may be painful to the touch. It should usually decrease in size and tenderness within 1-2 weeks. °HOME CARE INSTRUCTIONS °· Take medicines only as directed by your health care provider. °· You may shower 24-48 hours after the procedure or as directed by your health care provider. Remove the bandage (dressing) and gently wash the site with plain soap and water. Pat the area dry with a clean towel. Do not rub the site, because this may cause bleeding. °· Do not take baths, swim, or use a hot tub until your health care provider approves. °· Check your insertion site every day for redness, swelling, or drainage. °· Do not apply powder or lotion to the site. °· Do not flex or bend the affected arm for 24 hours or as directed by your health care provider. °· Do not push or pull heavy objects with the affected arm for 24 hours or as directed by your health care provider. °· Do not lift over 10 lb (4.5 kg) for 5 days after your procedure or as directed by your health care provider. °· Ask your health care provider when it is okay to: °¨ Return to work or school. °¨ Resume usual physical activities or sports. °¨ Resume sexual activity. °· Do not drive home if you are discharged the same day as the procedure. Have someone else drive you. °· You may drive 24 hours after the procedure unless otherwise  instructed by your health care provider. °· Do not operate machinery or power tools for 24 hours after the procedure. °· If your procedure was done as an outpatient procedure, which means that you went home the same day as your procedure, a responsible adult should be with you for the first 24 hours after you arrive home. °· Keep all follow-up visits as directed by your health care provider. This is important. °SEEK MEDICAL CARE IF: °· You have a fever. °· You have chills. °· You have increased bleeding from the radial site. Hold pressure on the site. °SEEK IMMEDIATE MEDICAL CARE IF: °· You have unusual pain at the radial site. °· You have redness, warmth, or swelling at the radial site. °· You have drainage (other than a small amount of blood on the dressing) from the radial site. °· The radial site is bleeding, and the bleeding does not stop after 30 minutes of holding steady pressure on the site. °· Your arm or hand becomes pale, cool, tingly, or numb. °  °This information is not intended to replace advice given to you by your health care provider. Make sure you discuss any questions you have with your health care provider. °  °Document Released: 10/18/2010 Document Revised: 10/06/2014 Document Reviewed: 04/03/2014 °Elsevier Interactive Patient Education ©2016 [employer] ° °

## 2015-10-26 NOTE — Interval H&P Note (Signed)
History and Physical Interval Note:  10/26/2015 7:38 AM  Cory Mcmahon  has presented today for surgery, with the diagnosis of excelerating angina  The various methods of treatment have been discussed with the patient and family. After consideration of risks, benefits and other options for treatment, the patient has consented to  Procedure(s): Left Heart Cath and Coronary Angiography (N/A) as a surgical intervention .  The patient's history has been reviewed, patient examined, no change in status, stable for surgery.  I have reviewed the patient's chart and labs.  Questions were answered to the patient's satisfaction.   Cath Lab Visit (complete for each Cath Lab visit)  Clinical Evaluation Leading to the Procedure:   ACS: No.  Non-ACS:    Anginal Classification: CCS III  Anti-ischemic medical therapy: Minimal Therapy (1 class of medications)  Non-Invasive Test Results: No non-invasive testing performed  Prior CABG: No previous CABG        Theron Arista [facility] 10/26/2015 7:38 AM

## 2015-10-29 MED FILL — Verapamil HCl IV Soln 2.5 MG/ML: INTRAVENOUS | Qty: 2 | Status: AC

## 2015-10-31 ENCOUNTER — Ambulatory Visit: Payer: Self-pay | Admitting: Physician Assistant

## 2015-11-05 ENCOUNTER — Encounter: Payer: Self-pay | Admitting: Physician Assistant

## 2016-01-23 ENCOUNTER — Encounter: Payer: Self-pay | Admitting: Physician Assistant

## 2016-07-27 IMAGING — DX DG CHEST 2V
2 series · 2 of 2 positions shown · non-contrast
Comparison: None.

CLINICAL DATA: Left anterior chest pain. Shortness of breath.
Former smoker.

EXAM:
CHEST  2 VIEW

[chest pa]
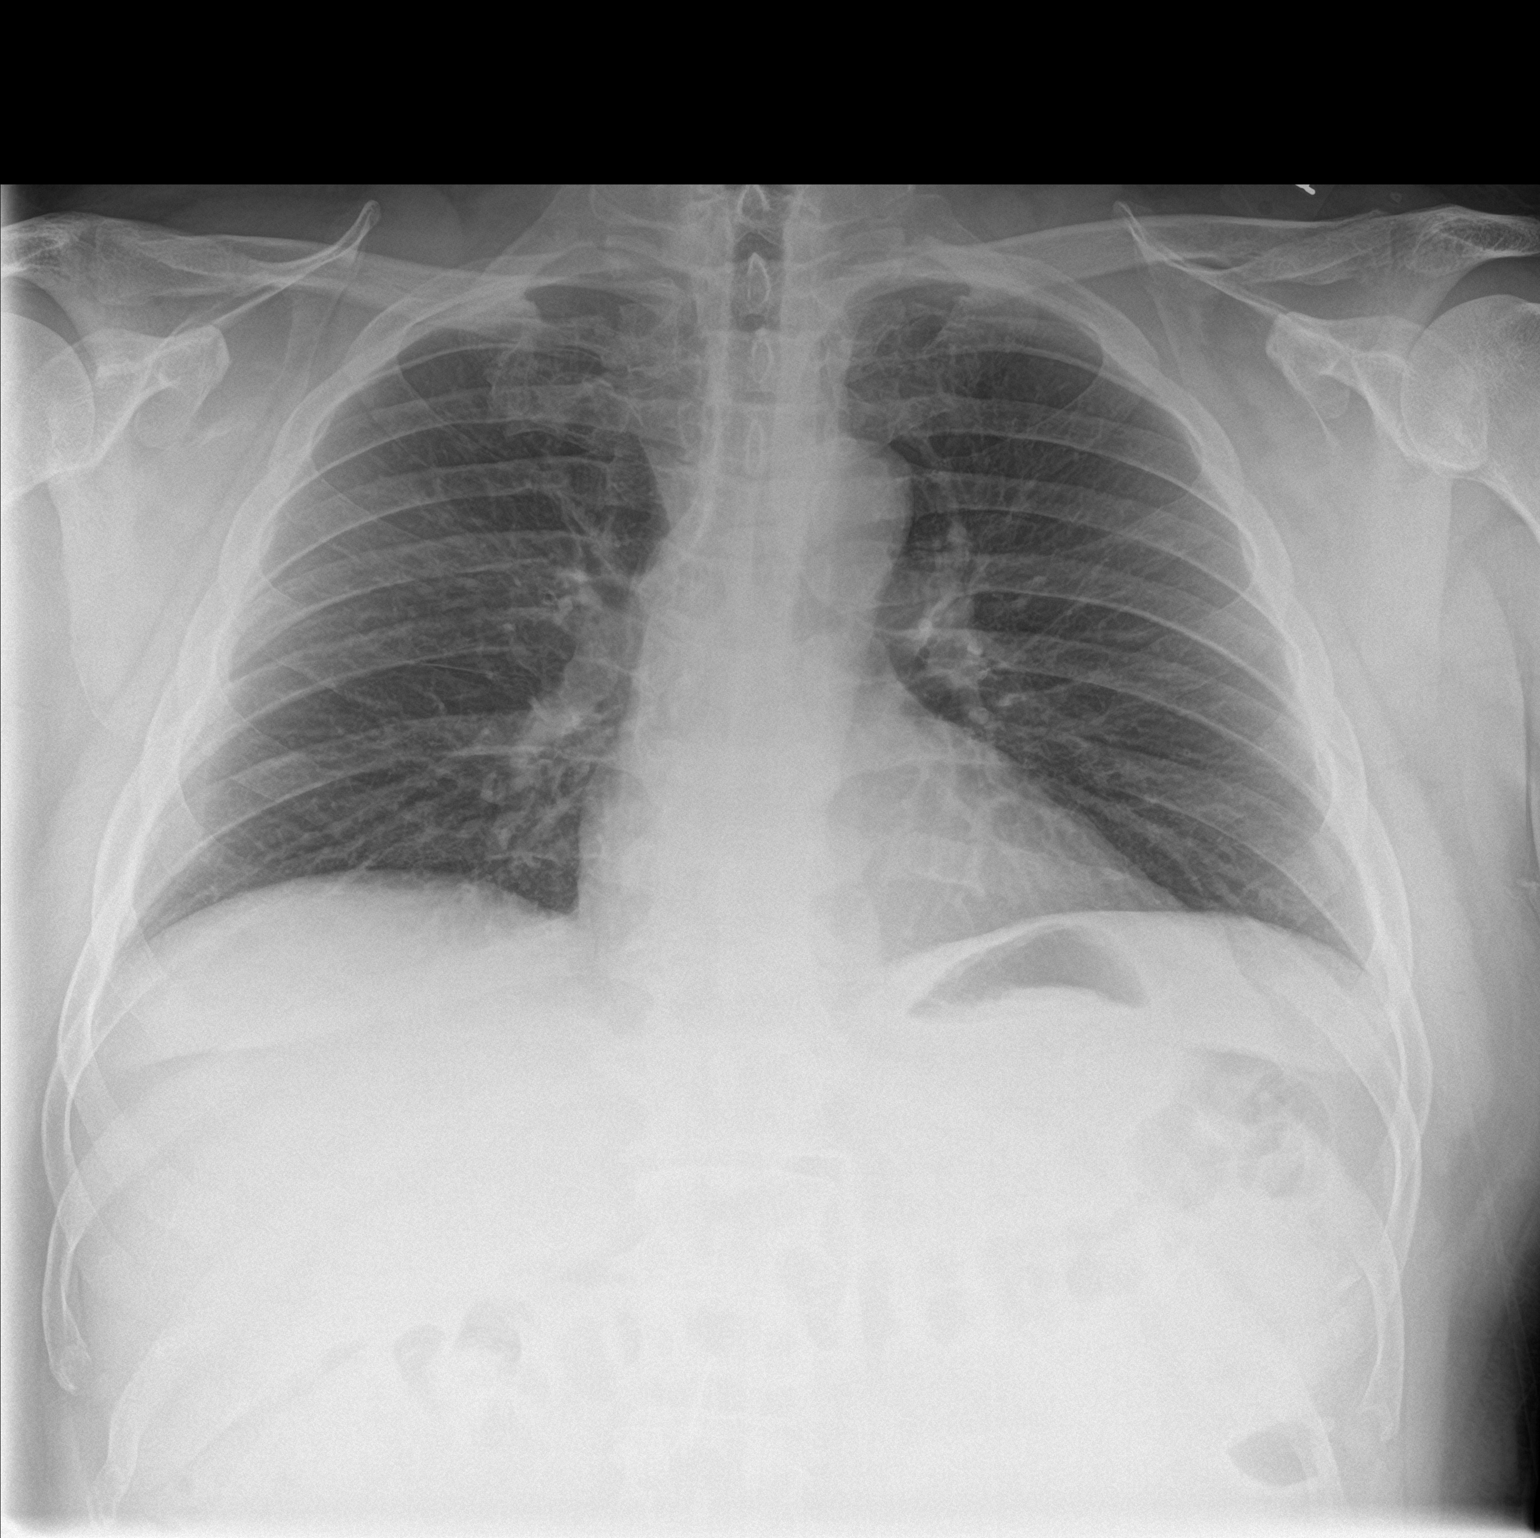

[chest lat]
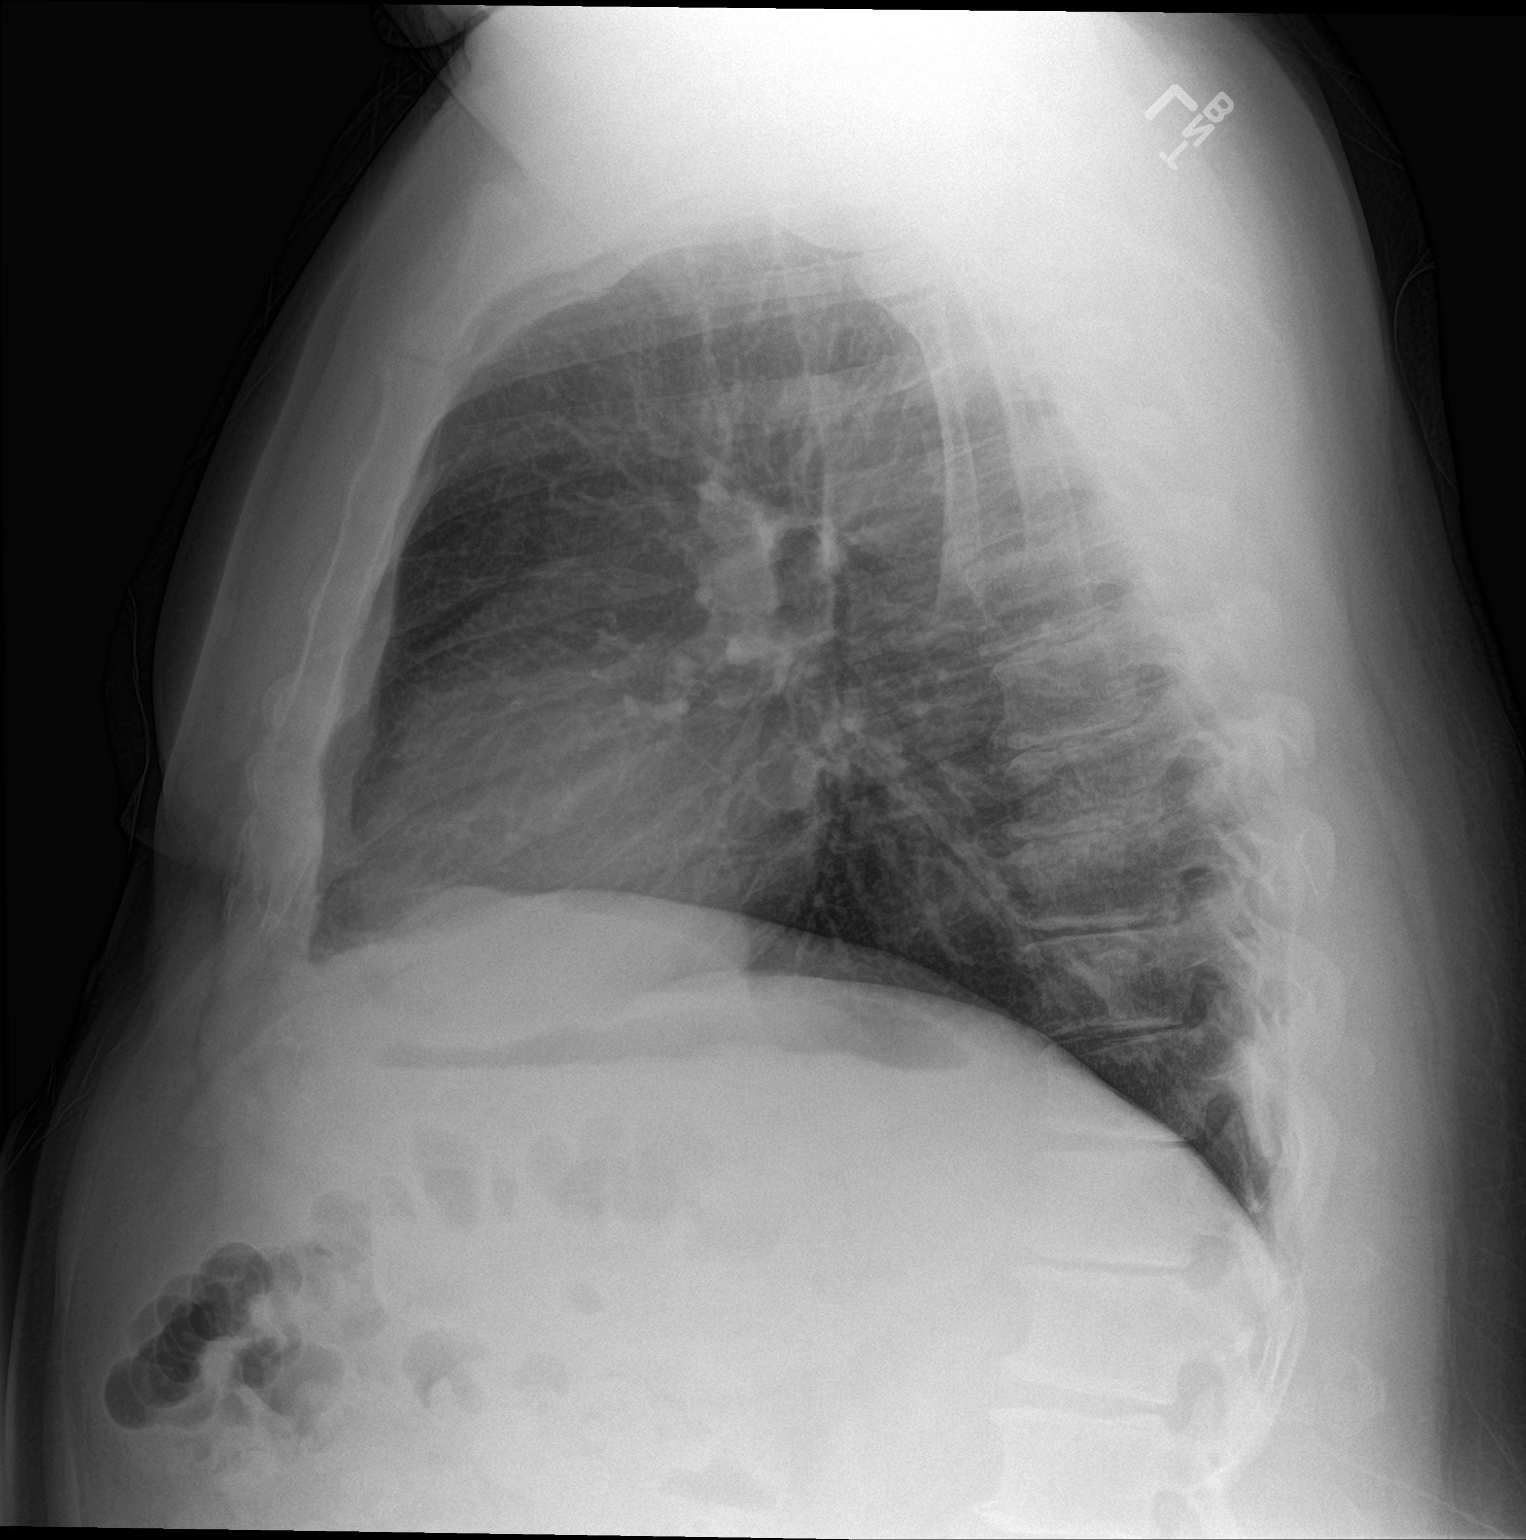

[2 of 2 positions shown; findings below may reference images not displayed]

FINDINGS: Normal cardiac silhouette and mediastinal contours given slightly
reduced lung volumes. No focal airspace opacities. No pleural
effusion or pneumothorax. No evidence of edema. Age-indeterminate
though presumably chronic mild (< 25%) compression deformity
involving the approximate T11 vertebral body with associated focal
kyphosis at this location. Regional soft tissues appear normal.
IMPRESSION: 1. No definite acute cardiopulmonary disease on this hypoventilated
examination.
2. Age-indeterminate though presumably chronic mild (under 25%)
compression deformity involving the superior endplate of the
approximate T11 vertebral body. Correlation point tenderness at this
location is recommended.

## 2017-08-24 NOTE — Congregational Nurse Program (Signed)
Congregational Nurse Program Note  Date of Encounter: 08/24/2017  Past Medical History: Past Medical History:  Diagnosis Date  . Essential hypertension 2000  . Hyperlipidemia   . Type 2 diabetes mellitus (HCC) 2000    Encounter Details: CNP Questionnaire - 08/24/17 1100      Questionnaire   Patient Status  Not Applicable    Race  White or Caucasian    Location Patient Served At  [place]    Insurance  Not Applicable    Uninsured  Not Applicable    Food  Yes, have food insecurities    Housing/Utilities  Within past 12 months, unable to get needed utilities (heat, electricity)    Transportation  No transportation needs    Interpersonal Safety  Yes, feel physically and emotionally safe where you currently live    Medication  Yes, have medication insecurities    Medical Provider  No    Referrals  Area Agency    ED Visit Averted  Not Applicable      A 51 year old white male [facility] for assistance with establsihing a PCP.  Client has HTN and is a Type II Diabetic; is on medication.  Client is with a doctor at the present time and does not have Insurance.  [facility].  Client was informed of flu vaccines given at the [facility] in [place].  Encourage to call if any further concerns and questions.  Pearletha AlfredJan Mazi Schuff,RN CNP [phone].

## 2017-12-04 NOTE — Congregational Nurse Program (Signed)
Congregational Nurse Program Note  Date of Encounter: 11/30/2017  Past Medical History: Past Medical History:  Diagnosis Date  . Essential hypertension 2000  . Hyperlipidemia   . Type 2 diabetes mellitus (HCC) 2000    Encounter Details: CNP Questionnaire - 11/30/17 1105      Questionnaire   Patient Status  Not Applicable    Race  White or Caucasian    Location Patient Served At  [place]    Insurance  Not Applicable    Uninsured  Uninsured (Subsequent visits/quarter)    Food  No food insecurities    Housing/Utilities  Yes, have permanent housing    Transportation  No transportation needs    Interpersonal Safety  Yes, feel physically and emotionally safe where you currently live    Medication  No medication insecurities    Medical Provider  Yes    Referrals  Area Agency    ED Visit Averted  Not Applicable     Client was seen today to pick referral form for next appointment [facility] in [place], [place] On 12/01/17.  BP was elevated today, pt had not yet taken his medication.  Encourage to keep check on BP and take medications as directed.  Hewitt ShortsJan Taneika Choi, RN  [phone].

## 2018-10-25 ENCOUNTER — Other Ambulatory Visit: Payer: Self-pay

## 2018-10-25 ENCOUNTER — Emergency Department (HOSPITAL_COMMUNITY)
Admission: EM | Admit: 2018-10-25 | Discharge: 2018-10-25 | Disposition: A | Discharge status: Home / Self Care | Payer: Self-pay | Attending: Emergency Medicine | Admitting: Emergency Medicine

## 2018-10-25 DIAGNOSIS — Z5321 Procedure and treatment not carried out due to patient leaving prior to being seen by health care provider: Secondary | ICD-10-CM | POA: Insufficient documentation

## 2018-10-25 DIAGNOSIS — R1032 Left lower quadrant pain: Secondary | ICD-10-CM | POA: Insufficient documentation

## 2018-10-25 LAB — URINALYSIS, ROUTINE W REFLEX MICROSCOPIC
BACTERIA UA: NONE SEEN
BILIRUBIN URINE: NEGATIVE
Glucose, UA: NEGATIVE mg/dL
Hgb urine dipstick: NEGATIVE
KETONES UR: NEGATIVE mg/dL
LEUKOCYTES UA: NEGATIVE
NITRITE: NEGATIVE
PROTEIN: 30 mg/dL — AB
Specific Gravity, Urine: 1.016 (ref 1.005–1.030)
pH: 5 (ref 5.0–8.0)

## 2018-10-25 LAB — CBC
HEMATOCRIT: 50.2 % (ref 39.0–52.0)
HEMOGLOBIN: 16.6 g/dL (ref 13.0–17.0)
MCH: 31.9 pg (ref 26.0–34.0)
MCHC: 33.1 g/dL (ref 30.0–36.0)
MCV: 96.5 fL (ref 80.0–100.0)
Platelets: 179 10*3/uL (ref 150–400)
RBC: 5.2 MIL/uL (ref 4.22–5.81)
RDW: 11.5 % (ref 11.5–15.5)
WBC: 6.7 10*3/uL (ref 4.0–10.5)
nRBC: 0 % (ref 0.0–0.2)

## 2018-10-25 LAB — COMPREHENSIVE METABOLIC PANEL
ALBUMIN: 4.5 g/dL (ref 3.5–5.0)
ALT: 45 U/L — ABNORMAL HIGH (ref 0–44)
AST: 31 U/L (ref 15–41)
Alkaline Phosphatase: 77 U/L (ref 38–126)
Anion gap: 9 (ref 5–15)
BUN: 12 mg/dL (ref 6–20)
CHLORIDE: 104 mmol/L (ref 98–111)
CO2: 26 mmol/L (ref 22–32)
Calcium: 9.7 mg/dL (ref 8.9–10.3)
Creatinine, Ser: 0.77 mg/dL (ref 0.61–1.24)
GFR calc Af Amer: >60 mL/min (ref >60)
GFR calc non Af Amer: >60 mL/min (ref >60)
Glucose, Bld: 130 mg/dL — ABNORMAL HIGH (ref 70–99)
POTASSIUM: 4.5 mmol/L (ref 3.5–5.1)
Sodium: 139 mmol/L (ref 135–145)
Total Bilirubin: 0.7 mg/dL (ref 0.3–1.2)
Total Protein: 8 g/dL (ref 6.5–8.1)

## 2018-10-25 LAB — LIPASE, BLOOD: LIPASE: 28 U/L (ref 11–51)

## 2018-10-25 MED ORDER — SODIUM CHLORIDE 0.9% FLUSH: 3.0000 mL | Freq: Once | INTRAVENOUS | Status: DC

## 2018-10-25 NOTE — ED Notes (Signed)
Patient called multiple times for room, no answer. Not in waiting area.

## 2018-10-25 NOTE — ED Notes (Signed)
ED Provider at bedside. 

## 2018-10-25 NOTE — ED Triage Notes (Signed)
Patient reports LLQ abdominal pain for a week and a half. Patient states his stools have been very dark and he has had hematuria. Pain radiates into his L flank.
